# Patient Record
Sex: Female | Born: 1960 | Race: White | Hispanic: No | Smoking: Former smoker
Health system: Southern US, Community
[De-identification: ages and names within clinical notes are randomized; demographics above are authoritative.]

## PROBLEM LIST (undated history)

## (undated) DIAGNOSIS — H539 Unspecified visual disturbance: Secondary | ICD-10-CM

## (undated) DIAGNOSIS — M1990 Unspecified osteoarthritis, unspecified site: Secondary | ICD-10-CM

## (undated) DIAGNOSIS — N189 Chronic kidney disease, unspecified: Secondary | ICD-10-CM

## (undated) DIAGNOSIS — T4145XA Adverse effect of unspecified anesthetic, initial encounter: Secondary | ICD-10-CM

## (undated) DIAGNOSIS — T8859XA Other complications of anesthesia, initial encounter: Secondary | ICD-10-CM

## (undated) DIAGNOSIS — G629 Polyneuropathy, unspecified: Secondary | ICD-10-CM

## (undated) DIAGNOSIS — G35D Multiple sclerosis, unspecified: Secondary | ICD-10-CM

## (undated) DIAGNOSIS — G35 Multiple sclerosis: Secondary | ICD-10-CM

## (undated) HISTORY — DX: Unspecified osteoarthritis, unspecified site: M19.90

## (undated) HISTORY — DX: Chronic kidney disease, unspecified: N18.9

## (undated) HISTORY — PX: ENDOMETRIAL ABLATION: SHX621

## (undated) HISTORY — PX: HIP FRACTURE SURGERY: SHX118

## (undated) HISTORY — PX: TUBAL LIGATION: SHX77

## (undated) HISTORY — PX: CARPAL TUNNEL RELEASE: SHX101

## (undated) HISTORY — PX: OTHER SURGICAL HISTORY: SHX169

## (undated) HISTORY — DX: Multiple sclerosis, unspecified: G35.D

## (undated) HISTORY — DX: Unspecified visual disturbance: H53.9

## (undated) HISTORY — DX: Multiple sclerosis: G35

## (undated) HISTORY — DX: Polyneuropathy, unspecified: G62.9

---

## 1998-08-21 ENCOUNTER — Other Ambulatory Visit: Admission: RE | Admit: 1998-08-21 | Discharge: 1998-08-21 | Payer: Self-pay | Admitting: Obstetrics and Gynecology

## 1999-11-14 ENCOUNTER — Other Ambulatory Visit: Admission: RE | Admit: 1999-11-14 | Discharge: 1999-11-14 | Payer: Self-pay | Admitting: Obstetrics and Gynecology

## 2000-12-30 ENCOUNTER — Other Ambulatory Visit: Admission: RE | Admit: 2000-12-30 | Discharge: 2000-12-30 | Payer: Self-pay | Admitting: Obstetrics and Gynecology

## 2002-06-15 ENCOUNTER — Other Ambulatory Visit: Admission: RE | Admit: 2002-06-15 | Discharge: 2002-06-15 | Payer: Self-pay | Admitting: Obstetrics and Gynecology

## 2003-10-11 ENCOUNTER — Other Ambulatory Visit: Admission: RE | Admit: 2003-10-11 | Discharge: 2003-10-11 | Payer: Self-pay | Admitting: Obstetrics and Gynecology

## 2004-01-31 ENCOUNTER — Ambulatory Visit: Payer: Self-pay | Admitting: Cardiology

## 2004-02-06 ENCOUNTER — Ambulatory Visit: Payer: Self-pay | Admitting: Cardiovascular Disease

## 2004-03-15 ENCOUNTER — Ambulatory Visit: Payer: Self-pay | Admitting: Cardiology

## 2004-03-19 ENCOUNTER — Ambulatory Visit: Payer: Self-pay | Admitting: Cardiology

## 2004-05-08 ENCOUNTER — Ambulatory Visit: Payer: Self-pay | Admitting: Cardiology

## 2004-05-14 ENCOUNTER — Ambulatory Visit: Payer: Self-pay | Admitting: Cardiology

## 2004-07-24 ENCOUNTER — Ambulatory Visit: Payer: Self-pay | Admitting: Cardiology

## 2004-07-30 ENCOUNTER — Ambulatory Visit: Payer: Self-pay | Admitting: Cardiology

## 2004-09-03 ENCOUNTER — Ambulatory Visit: Payer: Self-pay | Admitting: Internal Medicine

## 2004-09-10 ENCOUNTER — Ambulatory Visit: Payer: Self-pay | Admitting: Cardiology

## 2004-11-08 ENCOUNTER — Other Ambulatory Visit: Admission: RE | Admit: 2004-11-08 | Discharge: 2004-11-08 | Payer: Self-pay | Admitting: Obstetrics and Gynecology

## 2005-01-09 ENCOUNTER — Ambulatory Visit: Payer: Self-pay | Admitting: Internal Medicine

## 2005-01-14 ENCOUNTER — Ambulatory Visit: Payer: Self-pay | Admitting: Internal Medicine

## 2005-03-08 ENCOUNTER — Ambulatory Visit: Payer: Self-pay | Admitting: Internal Medicine

## 2005-04-04 ENCOUNTER — Ambulatory Visit: Payer: Self-pay | Admitting: Internal Medicine

## 2005-04-25 ENCOUNTER — Ambulatory Visit: Payer: Self-pay | Admitting: Cardiology

## 2005-06-11 ENCOUNTER — Encounter (INDEPENDENT_AMBULATORY_CARE_PROVIDER_SITE_OTHER): Payer: Self-pay | Admitting: *Deleted

## 2005-06-11 ENCOUNTER — Ambulatory Visit (HOSPITAL_COMMUNITY): Admission: RE | Admit: 2005-06-11 | Discharge: 2005-06-11 | Payer: Self-pay | Admitting: Obstetrics and Gynecology

## 2005-08-09 ENCOUNTER — Ambulatory Visit: Payer: Self-pay | Admitting: Internal Medicine

## 2005-08-15 ENCOUNTER — Ambulatory Visit: Payer: Self-pay | Admitting: Internal Medicine

## 2005-12-24 ENCOUNTER — Ambulatory Visit (HOSPITAL_COMMUNITY): Admission: RE | Admit: 2005-12-24 | Discharge: 2005-12-25 | Payer: Self-pay | Admitting: Obstetrics and Gynecology

## 2005-12-24 ENCOUNTER — Encounter (INDEPENDENT_AMBULATORY_CARE_PROVIDER_SITE_OTHER): Payer: Self-pay | Admitting: *Deleted

## 2006-03-07 ENCOUNTER — Ambulatory Visit: Payer: Self-pay | Admitting: Internal Medicine

## 2006-03-07 LAB — CONVERTED CEMR LAB
AST: 64 units/L — ABNORMAL HIGH (ref 0–37)
Alkaline Phosphatase: 56 units/L (ref 39–117)
Chol/HDL Ratio, serum: 3.2
Cholesterol: 115 mg/dL (ref 0–200)
Total Bilirubin: 0.9 mg/dL (ref 0.3–1.2)
Total Protein: 6.9 g/dL (ref 6.0–8.3)
Triglyceride fasting, serum: 63 mg/dL (ref 0–149)

## 2006-03-13 ENCOUNTER — Ambulatory Visit: Payer: Self-pay | Admitting: Cardiology

## 2006-08-19 ENCOUNTER — Ambulatory Visit: Payer: Self-pay | Admitting: Gastroenterology

## 2006-08-22 ENCOUNTER — Encounter: Payer: Self-pay | Admitting: Gastroenterology

## 2006-08-22 ENCOUNTER — Ambulatory Visit: Payer: Self-pay | Admitting: Gastroenterology

## 2006-09-17 ENCOUNTER — Ambulatory Visit: Payer: Self-pay | Admitting: Internal Medicine

## 2006-09-17 LAB — CONVERTED CEMR LAB
ALT: 30 units/L (ref 0–35)
AST: 40 units/L — ABNORMAL HIGH (ref 0–37)
Alkaline Phosphatase: 59 units/L (ref 39–117)
Bilirubin, Direct: 0.3 mg/dL (ref 0.0–0.3)
HDL: 23.6 mg/dL — ABNORMAL LOW (ref >39.0)
VLDL: 29 mg/dL (ref 0–40)

## 2006-10-02 ENCOUNTER — Ambulatory Visit: Payer: Self-pay | Admitting: Cardiology

## 2007-07-08 ENCOUNTER — Encounter: Admission: RE | Admit: 2007-07-08 | Discharge: 2007-10-06 | Payer: Self-pay | Admitting: Neurology

## 2007-07-15 ENCOUNTER — Ambulatory Visit: Payer: Self-pay | Admitting: Cardiology

## 2007-07-15 LAB — CONVERTED CEMR LAB
ALT: 25 units/L (ref 0–35)
Bilirubin, Direct: 0.2 mg/dL (ref 0.0–0.3)
HDL: 23.7 mg/dL — ABNORMAL LOW (ref >39.0)
LDL Cholesterol: 68 mg/dL (ref 0–99)
Total Bilirubin: 0.8 mg/dL (ref 0.3–1.2)
Total CHOL/HDL Ratio: 4.6
VLDL: 17 mg/dL (ref 0–40)

## 2007-07-23 ENCOUNTER — Ambulatory Visit: Payer: Self-pay | Admitting: Internal Medicine

## 2007-09-29 ENCOUNTER — Ambulatory Visit: Payer: Self-pay | Admitting: Cardiology

## 2007-09-29 ENCOUNTER — Telehealth: Payer: Self-pay | Admitting: Gastroenterology

## 2007-09-29 LAB — CONVERTED CEMR LAB
Albumin: 4.1 g/dL (ref 3.5–5.2)
Cholesterol: 136 mg/dL (ref 0–200)
HDL: 41.5 mg/dL (ref >39.0)
LDL Cholesterol: 69 mg/dL (ref 0–99)
Total CHOL/HDL Ratio: 3.3
Total Protein: 6.7 g/dL (ref 6.0–8.3)
Triglycerides: 126 mg/dL (ref 0–149)
VLDL: 25 mg/dL (ref 0–40)

## 2007-10-15 ENCOUNTER — Ambulatory Visit: Payer: Self-pay | Admitting: Internal Medicine

## 2007-12-29 ENCOUNTER — Telehealth: Payer: Self-pay | Admitting: Gastroenterology

## 2008-03-17 ENCOUNTER — Ambulatory Visit (HOSPITAL_BASED_OUTPATIENT_CLINIC_OR_DEPARTMENT_OTHER): Admission: RE | Admit: 2008-03-17 | Discharge: 2008-03-17 | Payer: Self-pay | Admitting: Urology

## 2008-03-18 ENCOUNTER — Telehealth: Payer: Self-pay | Admitting: Gastroenterology

## 2008-04-08 ENCOUNTER — Ambulatory Visit (HOSPITAL_BASED_OUTPATIENT_CLINIC_OR_DEPARTMENT_OTHER): Admission: RE | Admit: 2008-04-08 | Discharge: 2008-04-08 | Payer: Self-pay | Admitting: Orthopedic Surgery

## 2008-08-02 ENCOUNTER — Other Ambulatory Visit: Admission: RE | Admit: 2008-08-02 | Discharge: 2008-08-02 | Payer: Self-pay | Admitting: Family Medicine

## 2010-06-25 LAB — POCT HEMOGLOBIN-HEMACUE
Hemoglobin: 12.2 g/dL (ref 12.0–15.0)
Hemoglobin: 11.9 g/dL — ABNORMAL LOW (ref 12.0–15.0)

## 2010-06-25 LAB — URINE CULTURE: Colony Count: NO GROWTH

## 2010-07-24 NOTE — Assessment & Plan Note (Signed)
[facility]                               LIPID CLINIC NOTE   NAME:Kellie [name], Kellie [name]                   MRN:          [id number]  DATE:10/02/2006                            DOB:          March 02, 1961    The patient is seen in the lipid clinic for further evaluation,  medication titration associated with her hyperlipidemia in the setting  of documented coronary disease.  The patient has been feeling okay.  She  has been under increased stress associated with her son.  She has been  compliant with her medications.  She has reached her donut hole and  continues in that.  Her treatment for thrush has finally ceased.  She  has been feeling and doing well overall, otherwise.  She continues  Jazzercise for 1-2 hours a day, 5-7 days a week.   PAST MEDICAL HISTORY:  1. Hypercholesterolemia.  2. Multiple sclerosis.  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  4. Multiple drug allergies.  5. Hiatal hernia.   CURRENT MEDICATIONS:  1. Neurontin 600 mg 3 times daily.  2. Avonex 1 shot weekly.  3. Ditropan XL 10 mg twice daily.  4. Amantidine 100 mg twice daily.  5. Ritalin 20 mg twice daily.  6. Oxytrol patch 2 each week.  7. Lipitor 40 mg daily.  8. Fenofibrate 200 mg daily.  9. Cymbalta 90 mg daily.  10.Levaquin 250 mg each Monday, Wednesday, Friday.  11.Xanax 0.25 mg as needed.  12.Astelin nasal spray at bedtime.  13.Nasacort nasal spray at bedtime.  14.Optivar eye drops at bedtime.  15.Nexium 40 mg p.o. daily.  16.Valium 2.5 mg in the evening.  17.Juice Plus twice daily.  18.Advil as needed for muscle aches and pains and prior to Avonex as      well as acetaminophen as needed.  19.Allergy shots each week.   DRUG ALLERGIES:  PERCOCET and ERYTHROMYCIN ETHYL SUCCINATE.   LABORATORY DATA:  Reveals normal LFTs excepting an AFT of 40, these are  overall much improved from the patient's last panel.  Total cholesterol  122, triglyceride 146, HDL 23.6  which is not as good as it has been,  though she may have had increased stress,and lessened exercise.  LDL  cholesterol is 69.   PHYSICAL EXAMINATION:  Reveals patient weight of 138 pounds.  Blood  pressure is 130/76, heart rate is 84 and respirations are 15.   ASSESSMENT:  The patient has made good progress towards lipid-lowering  goals and has been compliant with her regimen.  I am pleased to see that  her liver functions have improved dramatically.  Will continue current  therapies.  Have provided samples to the patient to facilitate  compliance, and we will see her back in 6 months.  She will call with  questions or problems in the meantime.  Thank you for the opportunity to  care for this pleasant patient.      [name], PharmD, BCPS, CPP  Electronically Signed      Rollene Rotunda, MD, [facility]  Electronically Signed   MP/MedQ  DD: 10/10/2006  DT: 10/11/2006  Job #:  [id number]   cc:   Pricilla Riffle, MD, [facility]

## 2010-07-24 NOTE — Assessment & Plan Note (Signed)
[facility]                               LIPID CLINIC NOTE   NAME:Kellie [name], Kellie [name]                   MRN:          [id number]  DATE:10/15/2007                            DOB:          [birth year]    HISTORY:  The patient was seen in Lipid Clinic for hyperlipidemia and  multiple medication intolerances.  She has been feeling and doing well  since her last visit.  She continues on her 7 jazzercise classes each  week, 6 days a week.  She continues to follow an okay diet; in that she  monitors her fat and cholesterol intake, though she does continue to  have significant sugar intake on a regular basis.  She cannot tell me  that the Tysabri, that has been started for her multiple sclerosis, has  made any difference.  She states that she has not lost further activity.  She continues to be under a great deal of stress as she is considering  filing separation papers and continues to have several additional family  issues that are providing complex family relationship.   PAST MEDICAL HISTORY:  1. Hypercholesterolemia.  2. Multiple sclerosis.  3. Medullary sponge disease in the kidneys.  4. Multiple drug allergies and hiatal hernia.   CURRENT MEDICATIONS:  1. Neurontin 600 mg 3 times daily.  2. Avonex has been discontinued.  3. Ditropan XL 10 mg twice daily.  4. Lipitor 40 mg in the evening.  5. Phenobarbitone was discontinued at her last visit.  6. Cymbalta 90 mg daily.  7. Levaquin, Monday, Wednesday, Friday, 250 mg.  8. Xanax 0.25 mg daily as needed.  9. Astelin nasal spray daily at bedtime.  10.Nasacort nasal spray daily at bedtime.  11.Optivar eye drops daily.  12.Nexium 40 mg daily.  13.The patient also takes Ritalin 20 mg in the morning.  14.Astepro in the evening.  15.Tysabri every 4 weeks at Dr. Bonnita Hollow office.   ALLERGIES:  ERYTHROMYCIN ETHYLSUCCINATE, PERCOCET, LATEX THAT IS FOUND  IN TAPE and BAND-AID, and BABY POWDER.   REVIEW OF  SYSTEMS:  As stated in the HPI, otherwise negative.   PHYSICAL EXAMINATION:  Blood pressure today is 110/68, respirations are  16 and comfortable, and heart rate is 76.   LABORATORY DATA:  Revealed normal LFTs.  Total cholesterol 136,  triglyceride 126, HDL 41.5, and LDL 69.   ASSESSMENT:  The patient meets primary, secondary, and tertiary goals  for lipid lowering therapy at this time.  We will continue her  therapies.  I have congratulated her on being able to come off her  TriCor.  We will see her back in 6 months.  She will call if questions  or problems in the meantime.   Thank you for the opportunity to see this patient.      [name], PharmD, BCPS, CPP  Electronically Signed      Rollene Rotunda, MD, [facility]  Electronically Signed   MP/MedQ  DD: 10/15/2007  DT: 10/16/2007  Job #: [id number]   cc:   Thomas C. Daleen Squibb, MD, [facility]  Rollene Rotunda, MD, [facility]

## 2010-07-24 NOTE — Op Note (Signed)
Kellie [name], Kellie [name]               ACCOUNT NO.:  [id number]   MEDICAL RECORD NO.:  [id number]          PATIENT TYPE:  AMB   LOCATION:  [facility]                          FACILITY:  [facility]   PHYSICIAN:  Kellie Fitch. [name], M.D. DATE OF BIRTH:  January 02, 1961   DATE OF PROCEDURE:  04/08/2008  DATE OF DISCHARGE:                               OPERATIVE REPORT   PREOPERATIVE DIAGNOSIS:  Chronic entrapment neuropathy, right median  nerve at carpal tunnel.   POSTOPERATIVE DIAGNOSIS:  Chronic entrapment neuropathy, right median  nerve at carpal tunnel.   OPERATION:  Release of right transverse carpal ligament.   OPERATING SURGEON:  Kellie Fitch. Sypher, MD   ASSISTANT:  Marveen Reeks Dasnoit, PA-C   ANESTHESIA:  General by LMA.   SUPERVISING ANESTHESIOLOGIST:  Burna Forts, MD   INDICATIONS:  Kellie [name] is a 49 year old woman referred through  the courtesy of Dr. Epimenio Foot for evaluation of right carpal tunnel  syndrome.  Kellie [name] has a very complex medical history with a history  of multiple sclerosis, hypercholesterolemia, a history of medullary  sponge disease of the kidneys and a neurogenic bladder.   Dr. Epimenio Foot is a neurologist in [place], [place].  She is  referred due to a history of hand numbness and electrodiagnostic studies  confirming carpal tunnel syndrome.   Due to a failure to response to nonoperative measures, she is brought to  the operating room at this time for release of her right transverse  carpal ligament.   PROCEDURE IN DETAIL:  Kellie [name] was brought to the operating  room and placed in supine position upon the operating table.   Following the induction of general anesthesia by LMA technique, the  right arm was prepped with Betadine soap and solution and sterilely  draped.  A pneumatic tourniquet was applied to the proximal right  brachium.  Following exsanguination of the limb with an Esmarch bandage,  an arterial tourniquet was inflated to 220  mmHg.   Procedure commenced with a short incision in the line of the ring finger  in the palm.  Subcutaneous tissues were carefully divided within the  palmar fascia.  This was split longitudinally to the common sensory  branch of the median nerve.  These were followed back to transverse  carpal ligaments and gently isolated to the median nerve.  Ligament was  then gently separated from the median nerve with a Penfield 4 elevator  followed by release of the ligament subcutaneously into the distal  forearm.  This widely opened the carpal canal.  There was thickening of  the ulnar bursa.  No mass or other predicaments were noted.  Bleeding  points along the margin of the released ligament were electrocauterized  with bipolar current followed by repair of the skin with intradermal 3-0  Prolene suture.   A compressive dressing was applied with a volar plaster splint  maintaining the wrist in 5 degrees of dorsiflexion.   For aftercare, Kellie [name] was provided a prescription for Darvocet-N  100 one p.o. q.6 h. p.r.n. pain.   She states she has Vicodin  at home and may use this is as an alternative  medication.  We have also suggested that she use Advil 400 mg to 600 mg  p.o. q.6 h. as an adjunctive analgesic.      Kellie [name], M.D.  Electronically Signed     RVS/MEDQ  D:  04/08/2008  T:  04/08/2008  Job:  [id number]   cc:   Despina Arias
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
## 2010-07-24 NOTE — Assessment & Plan Note (Signed)
[facility]                         GASTROENTEROLOGY OFFICE NOTE   NAME:Kellie [name]                   MRN:          [id number]  DATE:08/19/2006                            DOB:          April 09, 1960    [name] is a 50 year old white female here for evaluation of  difficultly swallowing.   [name] has had multiple sclerosis for the last 10 years with  associated chronic fatigue and multiple complaints. She underwent a  total abdominal hysterectomy this past fall in October, and since that  time has had dry mouth, odynophagia, and dysphagia. She apparently had  several courses of Diflucan for oral candidiasis although she has never  had endoscopy. She recently underwent upper GI series which showed  narrowing of the cervical esophagus without other abnormalities. She  does take Nexium 40 mg a day for acid reflux, and apparently had  endoscopy several years ago although I do not have records of such in  our records.   The patient denies other gastrointestinal complaints such has change in  bowel habits, melena, hematochezia, or any hepatobiliary problems. Her  appetite is good but she has lost 7 to 8 pounds over the last year. She  denies any specific food intolerances.   PAST MEDICAL HISTORY:  She is followed closely by [facility] neurology  for multiple sclerosis and chronic fatigue. She additionally has  medullary sponge disease of her kidneys, numerous allergies,  hyperlipidemia, chronic bladder dysfunction, and apparently peripheral  neuropathy. She sees Dr. Danella Deis for a depigmentating skin rash of  unexplained etiology. On review of her chart. She has had abnormal liver  function test with transaminases approximately twice normal for the last  2 years. This apparently has been felt to be secondary to her statin  medicine. The patient has had previous hysterectomy as mentioned above,  and tubal ligation. She additionally suffers from  asthmatic bronchitis  and carpal tunnel syndrome.   MEDICATIONS:  1. Neurontin 600 mg three times a day.  2. Beta interferon one shot a week.  3. Ditropan XL 10 mg twice a day.  4. Amantadine 100 mg twice a day.  5. Ritalin 20 mg twice a day.  6. Oxytrol patch 2 per week.  7. Lipitor 40 mg a day.  8. Fenofibrate 200 mg a day.  9. Cymbalta 90 mg a day.  10.Levaquin on a regular basis as needed for UTIs.  11.Xanax 0.25 mg p.r.n.  12.__________ nasal spray.  13.Nasacort.  14.__________ eye drops p.r.n.  15.Nexium 40 mg a day.  16.Valium 2.5 mg at bedtime.  17.P.R.N. Advil.  18.Tylenol.   In the past she has had reactions to erythromycin.   FAMILY HISTORY:  Noncontributory.   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 She does not smoke  and uses ethanol socially.   REVIEW OF SYSTEMS:  Remarkable for excessive fatigue, chronic sore  throat, urinary incontinence, diffuse myalgias, and arthralgias.  Confusion and depression, excessive urination,  chronic back pain,  diplopia, edema of her lower extremities, insomnia, excessive thirst,  etc.   PHYSICAL EXAMINATION:  Shows to be an attractive white female appearing  her state age in no acute distress. I can not appreciate stigmata of  chronic liver disease. She is 5 feet 3 inches tall and weighs 105  pounds. Blood pressure 124/72. Pulse was 72 and regular.  There was no thyromegaly noted.  CHEST: Entirely clear and she appeared to be in a regular rhythm without  significant murmurs, gallops, or rubs. I could not appreciate  hepatosplenomegaly, abdominal masses, or tenderness.  PERIPHERAL EXTREMITIES: Unremarkable.  Inspection of the oral pharynx did show what appeared to be some thrush  on her tongue. I could appreciate no other mucosal lesions.  NEUROLOGICALLY: She was oriented x3. A formal neurologic exam was not  performed.   ASSESSMENT:  1.  Possible persistent candidiasis esophagus versus esophageal      motility disorder.  2. History of chronic gastroesophageal reflux disease on chronic      proton pump inhibitor therapy.  3. Multiple sclerosis with numerous attendant complications.  4. Chronic fatigue and chronic pain syndrome on multiple medications.  5. History of spastic bladder with dry mouth probably related to      Ditropan use.  6. Status post hysterectomy.  7. History of medullary sponge disease in kidney.  8. Chronic anxiety and depression.   RECOMMENDATIONS:  1. Repeat endoscopic exam at her convenience.  2. Continue all other medications as listed above.  3. Patient will need screening colonoscopy in the near future since      she is approaching the age of 36.     Vania Rea. Jarold Motto, MD, [facility], FAGA  Electronically Signed    DRP/MedQ  DD: 08/19/2006  DT: 08/19/2006  Job #: [id number]   cc:   Stacie Acres. Cliffton Asters, M.D.  Richard [employer] L. Earlene Plater, M.D.  Guy Sandifer Henderson Cloud, M.D.  Hope M. Danella Deis, M.D.

## 2010-07-24 NOTE — Op Note (Signed)
NAMECHRISTALYN, [name]               ACCOUNT NO.:  [id number]   MEDICAL RECORD NO.:  [id number]          PATIENT TYPE:  AMB   LOCATION:  [facility]                         [facility]ACILITY[facility]  [facility]   PHYSICIAN:  Martina Sinner, MD DATE OF BIRTH:  May 30, 1960   DATE OF PROCEDURE:  DATE OF DISCHARGE:                               OPERATIVE REPORT   DIAGNOSIS:  Muscle spasm, neurogenic bladder, refractory urge  incontinence.   SURGERY:  Cystoscopy, hydrodistention, Botox injection.   Kellie [name] has the above diagnosis.  She catheterizes twice a day.  She  may need to catheterize more frequent post Botox.   The patient is prepped and draped in the usual fashion.  ACMI scope is  utilized.  She had grade 2 to grade 3 out of 4 bladder trabeculation.  There are no diverticula.  Trigone is easily identified with ureteral  orifices.  There is no obvious cystitis.  I did send urine for culture.  She was given preoperative antibiotics.   I injected 200 cc and 20 cc of normal saline and Botox with my usual  template intramuscularly.  There is little to no bleeding.  Bladder was  emptied.  The rest of the inspection of the bladder mucosa and trigone  were normal.  She was hydrodistended at 500 mL, but on re-inspection,  she did not have any findings in keeping with interstitial cystitis.   Kellie [name] will be followed up in clinic and hopefully this will reach  her treatment goal.           ______________________________  Martina Sinner, MD  Electronically Signed     SAM/MEDQ  D:  03/17/2008  T:  03/17/2008  Job:  [id number]

## 2010-07-27 NOTE — Op Note (Signed)
[name], [name]               ACCOUNT NO.:  [id number]   MEDICAL RECORD NO.:  [id number]          PATIENT TYPE:  AMB   LOCATION:  SDC                           FACILITY:  [facility]   PHYSICIAN:  Kellie Sandifer. Henderson [name], M.D. DATE OF BIRTH:  Nov 26, 1960   DATE OF PROCEDURE:  12/24/2005  DATE OF DISCHARGE:                                 OPERATIVE REPORT   PREOPERATIVE DIAGNOSIS:  Metromenorrhagia.   POSTOPERATIVE DIAGNOSIS:  1. Metromenorrhagia.  2. Endometriosis.   PROCEDURE:  Laparoscopically-assisted vaginal hysterectomy with bilateral  salpingo-oophorectomy and ablation of endometriosis.   SURGEON:  Kellie Hedge, MD   ASSISTANT:  Kellie Cairo, MD   ANESTHESIA:  General endotracheal intubation.   SPECIMENS:  Uterus, bilateral tubes and ovaries to pathology.   ESTIMATED BLOOD LOSS:  100 mL.   INDICATIONS AND CONSENT:  This patient is a 50 year old married white female  G2, P1, status post tubal ligation and status post by endometrial ablation  who continues to have heavy irregular menses.  Details are dictated in the  history and physical.  Laparoscopically-assisted vaginal hysterectomy with  bilateral salpingo-oophorectomy has been discussed preoperatively.  Potential risks and complications are discussed preoperatively including but  limited to infection, bowel, bladder, ureteral damage, bleeding requiring  transfusion of blood products with possible transfusion reaction, HIV and  hepatitis acquisition, DVT, PE, pneumonia, fistula formation, postoperative  dyspareunia, laparotomy.  Issues of menopause have also been reviewed.  All  questions were answered and consent signed on the chart.   FINDINGS:  Upper abdomen is grossly normal.  Tubes are status post ligation.  Ovaries were normal.  Anterior cul-de-sac contains of scarring over the  vesicouterine peritoneum secondary to her previous cesarean section.  There  is a single implant of dark black endometriosis on the  vesicouterine  peritoneum in the midline.  Posteriorly there is a dark blue implant of  endometriosis on the right uterosacral ligament and white puckered areas  consistent with endometriosis on the left uterosacral ligament.  There is  also a heavy black implant of endometriosis over the course of the left  ureter high in the pelvis.   PROCEDURE:  The patient is taken to operating room where she is identified,  placed in dorsosupine position and general anesthesia is induced via  endotracheal intubation.  She is then placed in dorsal lithotomy position  where she is prepped abdominally and vaginally, bladder straight  catheterized.  Hulka tenaculum was placed in the uterus as a manipulator and  she is draped in sterile fashion.  The infraumbilical and suprapubic areas  were infiltrated midline with 1/2% plain Marcaine.  Small infraumbilical  incision is made and a disposable Veress needle was placed with a normal  syringe and drop test.  Two liters of gas were insufflated under low  pressure with good tympany in the right upper quadrant.  Veress needle was  removed and a 10/11 XL bladeless disposable trocar sleeve was placed, using  direct visualization with the diagnostic laparoscopic.  After placement it  was replaced with the operative laparoscopic.  Small suprapubic incision was  made in  the midline and a 5-mm XL bladeless disposable trocar sleeve was  placed under direct visualization without difficulty.  The above findings  were noted.  After noting the course of the ureters, the implants of  endometriosis are cauterized.  However, the implant over the course of the  left ureter high in the pelvis is not due to its location.  Then using the  Gyrus bipolar cautery cutting instrument, the infundibulopelvic ligament was  taken down on the right side working across the mesosalpinx across the round  ligament down to the level of the vesicouterine peritoneum.  Similar  procedure is  carried out on the left side.  Good hemostasis is noted.  Vesicouterine peritoneum was taken down cephalolaterally and good hemostasis  is maintained.  Suprapubic trocar sleeve was removed.  Instruments are  removed and attention is turned to vagina.  Posterior cul-de-sac was entered  sharply and the cervix was circumscribed with unipolar cautery.  Mucosa was  advanced sharply and bluntly.  Then using the Gyrus bipolar cautery  instrument, the uterosacral ligaments taken down followed by the bladder  pillars, cardinal ligaments and uterine vessels bilaterally.  Anterior cul-  de-sac is entered without difficulty.  The fundus with tubes and ovaries is  delivered posteriorly and the remaining pedicles were taken down and  specimens delivered.  The uterosacral ligaments are plicated to the vaginal  cuff bilaterally with separate sutures of 0 Monocryl.  All suture will be 0  Monocryl unless otherwise designated.  Suture was also placed at the cuff of  the 3 and 9 o'clock position to assure hemostasis.  Uterosacral ligaments  are plicated midline with a third suture.  Cuff was closed with figure-of-  eights.  Foley catheter is placed in the bladder and clear urine is noted.  Returning to the abdomen, pneumoperitoneum is reintroduced.  Again the 5 mm  suprapubic trocar sleeve is again placed under direct visualization without  difficulty.  Careful inspection reveals minor bleeding at peritoneal edges  which is controlled with bipolar cautery.  Reinspection under reduced  pneumoperitoneum reveals good hemostasis.  Excess fluid is removed.  All  instruments are removed and the pneumoperitoneum was reduced as well.  Skin  incisions were closed with Dermabond.  All counts correct.  The patient is  awakened, taken to recovery room in stable condition.      Kellie [name], M.D.  Electronically Signed    JET/MEDQ  D:  12/24/2005  T:  12/25/2005  Job:  [id number]

## 2010-07-27 NOTE — Op Note (Signed)
[name], Kellie [name]               ACCOUNT NO.:  [id number]   MEDICAL RECORD NO.:  [id number]          PATIENT TYPE:  AMB   LOCATION:  SDC                           FACILITY:  [facility]   PHYSICIAN:  Guy Sandifer. Henderson Cloud, M.D. DATE OF BIRTH:  06-18-1960   DATE OF PROCEDURE:  06/11/2005  DATE OF DISCHARGE:                                 OPERATIVE REPORT   PREOPERATIVE DIAGNOSIS:  Menorrhagia.   POSTOPERATIVE DIAGNOSIS:  Menorrhagia.   PROCEDURE:  NovaSure endometrial ablation with hysteroscopy, dilatation,  curettage of 1% Xylocaine paracervical block.   SURGEON:  Harold Hedge, M.D.   ANESTHESIA:  MAC.   SPECIMENS:  Endometrial curettings.   ESTIMATED BLOOD LOSS:  Less than 50 mL.   INTAKES AND OUTPUTS:  sorbitol distending media, 70-mL deficit   INDICATIONS AND CONSENT:  This patient is a 50 year old married white  female, G2, P1, status post tubal ligation with heavy menses.  Details are  dictated in the history and physical.  Hysteroscopy, dilatation, curettage  and endometrial ablation have been discussed with the patient  preoperatively.  Potential risks and complications were reviewed  preoperatively including, but limited to, infection, uterine perforation,  organ damage, bleeding requiring transfusion of blood products with possible  transfusion reaction, HIV and hepatitis acquisition, DVT, PE, pneumonia,  hysterectomy and recurrent heavy or abnormal bleeding.  All questions have  been answered and consent is signed and on the chart.   FINDINGS:  Endometrial canal is without abnormal structure.   PROCEDURE:  The patient is taken to operating room, where she is identified,  placed in the dorsal supine position and given sedation.  She is placed in  dorsal lithotomy position, where she is gently prepped, bladder straight-  catheterized and she is draped in a sterile fashion.  Bivalve speculum is  placed in the vagina.  Anterior cervical lip is injected with 1% Xylocaine  and grasped with a single-tooth tenaculum.  Paracervical block is then  placed with approximately 20 mL total of 1% plain Xylocaine at the 2, 4, 5,  7, 8  and 10 o'clock positions.  Uterus sounds to 8 cm and the cervix sounds  to 4 cm.  Cervix is dilated to a 29 dilator.  Diagnostic hysteroscope is  placed in the endocervical canal and advanced under direct visualization  using sorbitol distending media.  The above findings are noted.  Hysteroscope is withdrawn and sharp curettage is carried out.  The NovaSure  device is then placed.  A cavity width of 3.7 cm is noted.  The ablation is  then carried out for 1 minute 59 seconds.  The NovaSure is removed intact.  Reinspection with the diagnostic hysteroscope reveals no evidence of  perforation.  Hysteroscope is withdrawn, instruments are removed and the  procedure is ended.  All counts are correct.  The patient is taken to  recovery room in stable condition.      Guy Sandifer Henderson Cloud, M.D.  Electronically Signed     JET/MEDQ  D:  06/11/2005  T:  06/11/2005  Job:  [id number]

## 2010-07-27 NOTE — H&P (Signed)
NAMECAILA, Kellie [name]               ACCOUNT NO.:  [id number]   MEDICAL RECORD NO.:  [id number]          PATIENT TYPE:  AMB   LOCATION:  SDC                           [facility]ILITY:  [facility]   PHYSICIAN:  Guy Sandifer. Henderson Cloud, M.D. DATE OF BIRTH:  10-29-1960   DATE OF ADMISSION:  06/11/2005  DATE OF DISCHARGE:                                HISTORY & PHYSICAL   CHIEF COMPLAINT:  Heavy menses.   HISTORY OF PRESENT ILLNESS:  This patient is a 50 year old  Monday/Wednesday/Friday, gravida 2, para 1, status post tubal ligation who  has increasingly heavy menses.  After discussion of the options of  management, she is being admitted for NovaSure endometrial ablation.  Potential risks and complications have been discussed preoperatively.   PAST MEDICAL HISTORY:  1.  Multiple sclerosis.  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  3.  Hiatal hernia.  4.  Mitral valve prolapse.  5.  Dyslipidemia.   PAST SURGICAL HISTORY:  Tubal ligation.   PAST OBSTETRICAL HISTORY:  Cesarean section x1.   FAMILY HISTORY:  Unknown cancer in paternal grandmother.   MEDICATIONS:  1.  Fenofibrate  2.  Methylin  3.  Astelin  4.  Gabapentin  5.  Levaquin 250 mg three times a week.  6.  Nasacort.  7.  Oxytrol.  8.  Amantadine.  9.  Ditropan XL.  10. Lipitor.  11. Betaseron  12. Cymbalta.  13. Nexium.  14. Zyrtec.  15. Optivar eye drops.  16. Xanax.  17. Vitamins.   ALLERGIES:  ERYTHROMYCIN.   PHYSICAL EXAMINATION:  VITAL SIGNS:  Height 5 feet 3-1/4 inches, weight 137-  1/2 pounds, blood pressure 110/70.  LUNGS:  Clear to auscultation.  HEART:  Regular rate and rhythm.  BACK:  Without CVA tenderness.  BREASTS:  Not examined.  ABDOMEN:  Soft and nontender without masses.  PELVIC:  Vulva, vagina, and cervix without lesion.  Uterus normal size,  mobile, and nontender.  Adnexa nontender without masses.  EXTREMITIES:  Grossly normal.   ASSESSMENT:  Menorrhagia.   PLAN:  NovaSure endometrial  ablation.      Guy Sandifer Henderson Cloud, M.D.  Electronically Signed     JET/MEDQ  D:  05/29/2005  T:  05/29/2005  Job:  [id number]

## 2010-07-27 NOTE — H&P (Signed)
Kellie [name], BEACHEM               ACCOUNT NO.:  [id number]   MEDICAL RECORD NO.:  [id number]          PATIENT TYPE:  AMB   LOCATION:  SDC                           [facility]ILITY:  [facility]   PHYSICIAN:  Guy Sandifer. Henderson Cloud, M.D. DATE OF BIRTH:  06/14/1960   DATE OF ADMISSION:  DATE OF DISCHARGE:                                HISTORY & PHYSICAL   DATE OF ADMISSION:  December 24, 2005   CHIEF COMPLAINT:  Heavy menses.   HISTORY OF PRESENT ILLNESS:  This patient is a 50 year old married white  female G2, P1 status post tubal ligation who is also status post endometrial  ablation and continues to have heavy irregular menses.  At the time of her  ablation, a D&C was performed with benign pathology on the endometrial  curettings.  After discussion of the options she is being admitted for  laparoscopy-assisted vaginal hysterectomy and bilateral salpingo-  oophorectomy.  Potential risks, complications, issues of menopause have been  discussed.   PAST MEDICAL HISTORY:  1. Multiple sclerosis.  2. Medullary sponge disease of the kidneys.  3. Hiatal hernia.  4. Mitral valve prolapse.  5. Dyslipidemia.   PAST SURGICAL HISTORY:  1. Tubal ligation.  2. Hysteroscopy D&C and endometrial ablation.   OBSTETRICAL HISTORY:  Cesarean section x1.   FAMILY HISTORY:  Unknown type of cancer in paternal grandfather.   MEDICATIONS:  1. Neurontin 300 mg two b.i.d.  2. Zyrtec.  3. Nasacort AQ.  4. Astelin nasal spray.  5. Optivar eyedrops.  6. Cymbalta 60 mg q.h.s.  7. Oxytrol patch two times a week.  8. Ritalin b.i.d.  9. Amantadine b.i.d.  10.Lipitor 40 mg q.h.s.  11.Ditropan XL 10 mg b.i.d.  12.Vitamin C.  13.Nexium.  14.Generic TriCor.  15.Xanax 0.25 mg p.r.n.  16.Levaquin 500 mg one-half tablet Monday, Wednesday, Friday.  17.Betaseron shots every-other night.  18.Tylenol PM prior to her shot.   DRUG ALLERGIES:  ERYTHROMYCIN.   REVIEW OF SYSTEMS:  NEURO:  Denies headache.  CARDIO:  No chest  pain.  PULMONARY:  Denies shortness of breath.  GI:  Denies recent changes in bowel  habits.  GU:  The patient self-catheterizes twice a day.  Her urologist is  Dr. Darvin Neighbours.   PHYSICAL EXAMINATION:  VITAL SIGNS:  Height 5 feet 3 inches, weight 117  pounds.  Blood pressure 112/70.  HEENT:  Without thyromegaly.  LUNGS:  Clear to auscultation.  HEART:  Regular rate and rhythm.  BACK:  Without CVA tenderness.  BREASTS:  Without mass, traction, discharge.  ABDOMEN:  Soft, nontender, without masses.  PELVIC:  Vulva, vagina, cervix without lesion.  Uterus normal size, mobile,  nontender.  Adnexa nontender without masses.  EXTREMITIES:  Grossly within normal limits.  NEUROLOGIC:  Grossly within normal limits.   ASSESSMENT:  Menometrorrhagia.   PLAN:  Laparoscopic-assisted vaginal hysterectomy with bilateral salpingo-  oophorectomy.      Guy Sandifer Henderson Cloud, M.D.  Electronically Signed     JET/MEDQ  D:  12/23/2005  T:  12/23/2005  Job:  [id number]

## 2010-07-27 NOTE — Assessment & Plan Note (Signed)
[facility]                               LIPID CLINIC NOTE   NAME:CASSELL, TYMBER STALLINGS                   MRN:          [id number]  DATE:03/13/2006                            DOB:          1961/01/01    Ms. Lucita Lora comes in today for followup of her cholesterol management.  She has been compliant with her current cholesterol-lowering medications  which include Lipitor 40 mg daily and TriCor 145 mg daily.  She has not  had any problems tolerating these.  Her medications have not changed.  They include Betaseron, Tylenol PM, Nexium, Neurontin, Zyrtec, Ditropan  XL, Oxytrol patch, Ritalin, Xanax, Cymbalta, calcium with D, vitamin C,  Astelin, Nasacort, Optivar, Estrostep birth control pills, amantidine,  Levaquin, and a multivitamin.  She recently had a hysterectomy and  suffered from thrush afterwards.  This was treated with Diflucan.  Her  diet and exercise program continue to be great with Jazzercise  frequently.  Because of her surgery and thrush she has lost significant  weight.  Her weight today is 114 pounds; this is down by 7 pounds in  June 2007.  Other vitals include blood pressure of 115/80, heart rate  68.  Laboratory data includes total cholesterol 115, triglycerides 63,  HDL 35.8, LDL 67.  ALT is 61, AST is 64.   ASSESSMENT:  Ms. Lucita Lora continues to tolerate her current combination  of medications.  Her triglycerides are at goal of less than 150, her LDL  is at goal of less than 100, her HDL has improved and closer to the goal  of greater than 40, her liver function tests remain slightly elevated  but are trending down.  There are a couple of potential drug  interactions with Diflucan and Lipitor, and Diflucan and Levaquin.  We  talked to Ms. Lucita Lora about this and advised her to hold Lipitor if she  needs to take Diflucan for a short period of time for thrush.  Also,  there is a potential interaction between Levaquin and Diflucan, but Ms.  Lucita Lora has no cardiac history, we are not too concerned of that  interaction, but will continue to monitor her.   PLAN:  We are going to continue the same medications.  We gave her  samples of Lipitor and some prescriptions for Lipitor and TriCor.  We  are going to follow up with her in 6 months and repeat lipid and liver  panels.  She was instructed to call us with any questions or concerns in  the meantime.      Charolotte Eke, PharmD  Electronically Signed      Salvadore Farber, MD  Electronically Signed   TP/MedQ  DD: 03/13/2006  DT: 03/13/2006  Job #: [id number]   cc:   Stacie Acres. Cliffton Asters, M.D.

## 2010-09-27 ENCOUNTER — Ambulatory Visit (HOSPITAL_BASED_OUTPATIENT_CLINIC_OR_DEPARTMENT_OTHER): Admission: RE | Admit: 2010-09-27 | Payer: Medicare Other | Source: Ambulatory Visit | Admitting: Orthopedic Surgery

## 2010-10-09 ENCOUNTER — Ambulatory Visit (HOSPITAL_BASED_OUTPATIENT_CLINIC_OR_DEPARTMENT_OTHER)
Admission: RE | Admit: 2010-10-09 | Discharge: 2010-10-09 | Disposition: A | Discharge status: Home / Self Care | Payer: Medicare Other | Source: Ambulatory Visit | Attending: Orthopedic Surgery | Admitting: Orthopedic Surgery

## 2010-10-09 DIAGNOSIS — K219 Gastro-esophageal reflux disease without esophagitis: Secondary | ICD-10-CM | POA: Insufficient documentation

## 2010-10-09 DIAGNOSIS — F411 Generalized anxiety disorder: Secondary | ICD-10-CM | POA: Insufficient documentation

## 2010-10-09 DIAGNOSIS — G5600 Carpal tunnel syndrome, unspecified upper limb: Secondary | ICD-10-CM | POA: Insufficient documentation

## 2010-10-09 DIAGNOSIS — F3289 Other specified depressive episodes: Secondary | ICD-10-CM | POA: Insufficient documentation

## 2010-10-09 DIAGNOSIS — G35 Multiple sclerosis: Secondary | ICD-10-CM | POA: Insufficient documentation

## 2010-10-09 DIAGNOSIS — Z91040 Latex allergy status: Secondary | ICD-10-CM | POA: Insufficient documentation

## 2010-10-09 DIAGNOSIS — F329 Major depressive disorder, single episode, unspecified: Secondary | ICD-10-CM | POA: Insufficient documentation

## 2010-10-11 NOTE — Op Note (Signed)
NAME:  Kellie [name], Kellie [name]                  ACCOUNT NO.:  [id number]  MEDICAL RECORD NO.:  [id number]  LOCATION:                                 FACILITY:  PHYSICIAN:  Katy Fitch. Kayonna Lawniczak, M.D. 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  OPERATIONS:  Release of left transverse carpal ligament.  OPERATING SURGEON:  Katy Fitch. Uriyah Massimo, MD  ASSISTANT:  Annye Rusk, PA-C.  ANESTHESIA:  General by LMA.  SUPERVISING ANESTHESIOLOGIST:  Dr. Gelene Mink.  INDICATIONS:  Kellie [name] is a 50 year old woman with multiple complex medical problems, multiple drug allergies and a latex allergy.  She has had hand numbness and is status post right carpal tunnel release.  She now returns for left carpal tunnel release.  Preoperatively, her complex medical history was reviewed by our team as well as the anesthesia team.  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  Her surgical experience was conducted with latex allergy precautions.  Preoperatively, she was interviewed by Dr. Gelene Mink and recommended general anesthesia by LMA technique.  She was provided a prescription preoperatively for Vicodin at our office which she states that she is able to tolerate.  She filled the prescription prior to her scheduled surgery and has the medication at home at this time.  Preoperatively, she was reminded the potential risks, benefits of surgery.  Question are invited and answered in detail.  DESCRIPTION OF PROCEDURE:  Kellie [name] was brought to room 2 of the [facility] and placed in supine position on the operating  table.  Following the induction of general anesthesia by LMA technique, the left arm was prepped with Betadine soap solution, sterilely draped.  A pneumatic tourniquet was applied to the proximal left brachium.  Following exsanguination of the left arm with an Esmarch bandage, the arterial tourniquet was inflated to 220 mmHg.  A routine surgical time-out was conducted during which we reviewed her allergies to medication, latex and confirmed the tourniquet was set at 220 mmHg.  Procedure commenced with a short incision in the line of the ring finger and the palm.  Subcutaneous tissues were carefully divided revealing the palmar fascia.  This split longitudinally to reveal the common sensory branch of the median nerve.  These were followed back to the median nerve proper which was gently isolated from the transcarpal ligament with the aid of a [place] [employer], structural.  The ligament was then released with scissors along its ulnar border extending into the distal forearm. This widely opened  the carpal canal.  Bleeding points were electrocauterized with bipolar current.  The wound was then inspected for masses or predicaments.  None were identified.  The skin was then repaired with intradermal 3-0 Prolene suture.  A Steri-Strip and 2% lidocaine was infiltrated for postoperative analgesia.  A compressive dressing was applied with a volar plaster  splint maintaining Ms. Radel's wrist in 10 degrees of dorsiflexion.  For aftercare, she will return to home to the care of her family.  We will see her back for follow up in the office in 8 days.  She will use the Vicodin on an as-needed basis.     Katy Fitch Montarius Kitagawa, M.D.     RVS/MEDQ  D:  10/09/2010  T:  10/09/2010  Job:  [id number]  Electronically Signed by Josephine Igo M.D. on 10/11/2010 09:59:28 AM

## 2014-03-28 ENCOUNTER — Telehealth: Payer: Self-pay | Admitting: Neurology

## 2014-03-28 NOTE — Telephone Encounter (Signed)
Pt is calling stating she had an appointment today at [facility] and they just informed her that they don't have her Tysabri and they don't know when they will have it.  Please call and advise. You can leave message if there is no answer.

## 2014-03-28 NOTE — Telephone Encounter (Signed)
Spoke with Kellie [name] who sts she had an appt for a Tysabri infusion schd at [facility], but [facility] called and cancelled stating her med had not been delivered.  She has not been seen at [facility] yet--appt given with RAS tomorrow, 03-29-14 at 2pm/fim

## 2014-03-29 ENCOUNTER — Ambulatory Visit (INDEPENDENT_AMBULATORY_CARE_PROVIDER_SITE_OTHER): Payer: Medicare Other | Admitting: Neurology

## 2014-03-29 ENCOUNTER — Encounter: Payer: Self-pay | Admitting: Neurology

## 2014-03-29 VITALS — BP 140/80 | HR 72 | Resp 14 | Ht 63.0 in | Wt 136.4 lb

## 2014-03-29 DIAGNOSIS — F32A Depression, unspecified: Secondary | ICD-10-CM

## 2014-03-29 DIAGNOSIS — Z79899 Other long term (current) drug therapy: Secondary | ICD-10-CM | POA: Diagnosis not present

## 2014-03-29 DIAGNOSIS — F329 Major depressive disorder, single episode, unspecified: Secondary | ICD-10-CM | POA: Diagnosis not present

## 2014-03-29 DIAGNOSIS — G35 Multiple sclerosis: Secondary | ICD-10-CM

## 2014-03-29 DIAGNOSIS — R269 Unspecified abnormalities of gait and mobility: Secondary | ICD-10-CM | POA: Insufficient documentation

## 2014-03-29 DIAGNOSIS — Z87718 Personal history of other specified (corrected) congenital malformations of genitourinary system: Secondary | ICD-10-CM

## 2014-03-29 DIAGNOSIS — Z87448 Personal history of other diseases of urinary system: Secondary | ICD-10-CM | POA: Diagnosis not present

## 2014-03-29 DIAGNOSIS — M810 Age-related osteoporosis without current pathological fracture: Secondary | ICD-10-CM | POA: Diagnosis not present

## 2014-03-29 DIAGNOSIS — N319 Neuromuscular dysfunction of bladder, unspecified: Secondary | ICD-10-CM | POA: Diagnosis not present

## 2014-03-29 NOTE — Progress Notes (Addendum)
[facility]  PATIENT: Kellie [name] DOB: 07-22-1960  REFERRING CLINICIAN: Vernona Rieger HISTORY FROM: Patient  REASON FOR VISIT: Multiple Sclerosis   HISTORICAL  CHIEF COMPLAINT:  Chief Complaint  Patient presents with  . Multiple Sclerosis    Initial visit to establish care.  Last Tysabri 03-01-14. Denies new or worsening sx./fim    HISTORY OF PRESENT ILLNESS:  Kellie [name] is a 54 year old woman who presented with left sided numbness in September 1997.   She underwent an MRI and a lumbar puncture and was told MS was unlikely.   She had right sided numbness in January 1998 and had brain and spine MRI.    There were changes suggestive of MS and she was diagnosed in January 1998.   She was started on Betaseron.   She was on Betaseron x 10 years and then switched to Avonex for a year due to tolerability issues.   She had clinical exacerbations and MRI changes and then went on Tysabri in 2007 or 2008 and has had 92 infusions, her last infusion being 03/02/15.    She is transferring care from [facility] to [facility].  She tolerates Tysabri well.   She is JCV Ab negative.   Symptoms she has related to her MS is numbness, decreased gait, urinary retention, dysphagia and fatigue.  She reports tingling in the arms and legs. The right side is affected more than the left. The tingling is usually not too painful.  She is on gabapentin which she feels has helped some of the uncomfortable aspects of the tingling. She tolerates that well. She had her gait and balance generally do well but she does notice some decline when she gets hot or if she does a lot of exercise. She does not note significant weakness in the legs.   However, she has spasticity in her legs  She has had a lot of difficulties with urinary urgency and frequency.  She does intermittent catheterization up to 10 times a day. She is currently on oxybutynin combined with Myrbetriq which have helped the bladder  spasms. She denies any recent urinary tract infections.   She takes macrodantin bid.     She notes difficulty with dysphagia.  This seems to be multifactorial with a combination of dryness in her mouth (from med's?), Clinical changes (she had her esophagus stretched in the past) and possibly from the MS.  She has fatigue but she remains active. She does Jazzercise 5 days a week over 8 different classes. She feels better when she does her exercises.  She has had depression in the past but is generally feeling good currently.   She has mild anxiety that does not bother her too much.      She has not noted any significant cognitive issues and has not noted problems with her memory or speech.  She sleeps well as [place] as she takes the Valium with desmopressin every night.  She is now able to fall asleep easily and stays asleep most of the night. With the desmopressin she doesn't have to wake up to catheterize herself or because of incontinence.  REVIEW OF SYSTEMS:  Constitutional: No fevers, chills, sweats, or change in appetite Eyes: No visual changes, double vision, eye pain Ear, nose and throat: No hearing loss, ear pain, nasal congestion, sore throat Cardiovascular: No chest pain, palpitations Respiratory:  No shortness of breath at rest or with exertion.   No wheezes GastrointestinaI: No nausea, vomiting, diarrhea, abdominal pain.   She  has dysphagia Genitourinary: as above. Musculoskeletal:  No neck pain, mild back pain Integumentary: No rash, pruritus, skin lesions Neurological: as above Psychiatric: Mild depression at this time.  Mild anxiety Endocrine: No palpitations, diaphoresis, change in appetite, change in weigh or increased thirst Hematologic/Lymphatic:  No anemia, purpura, petechiae. Allergic/Immunologic: No itchy/runny eyes, nasal congestion, recent allergic reactions, rashes  ALLERGIES: Allergies  Allergen Reactions  . Erythromycin Nausea And Vomiting  .  Oxycodone-Acetaminophen     Other reaction(s): Hallucinations  . Sulfa Antibiotics     HOME MEDICATIONS:  Current outpatient prescriptions:  .  alendronate (FOSAMAX) 10 MG tablet, 70 mg., Disp: , Rfl:  .  Calcium Carbonate-Vitamin D 600-125 MG-UNIT TABS, Take by mouth., Disp: , Rfl:  .  cetirizine (ZYRTEC) 1 MG/ML syrup, Take 5 mg by mouth., Disp: , Rfl:  .  desmopressin (DDAVP) 0.1 MG tablet, Take 0.1 mg by mouth., Disp: , Rfl:  .  diazepam (VALIUM) 5 MG tablet, Take 5 mg by mouth., Disp: , Rfl:  .  famciclovir (FAMVIR) 250 MG tablet, TAKE 1 TABLET BY MOUTH TWICE A DAY, Disp: , Rfl:  .  FLUARIX QUADRIVALENT 0.5 ML injection, , Disp: , Rfl: 0 .  gabapentin (NEURONTIN) 600 MG tablet, Take 600 mg by mouth., Disp: , Rfl:  .  methylphenidate (RITALIN) 10 MG tablet, Take 20 mg by mouth., Disp: , Rfl:  .  mirabegron ER (MYRBETRIQ) 50 MG TB24 tablet, Take 50 mg by mouth., Disp: , Rfl:  .  natalizumab 300 mg in sodium chloride 0.9 % 100 mL, Inject into the vein., Disp: , Rfl:  .  omeprazole (PRILOSEC) 20 MG capsule, 2 (two) times daily., Disp: , Rfl:  .  ranitidine (ZANTAC) 150 MG tablet, Take 150 mg by mouth., Disp: , Rfl:  .  simvastatin (ZOCOR) 40 MG tablet, TAKE 1 TABLET AT BEDTIME FOR CHOLESTEROL, Disp: , Rfl:  .  venlafaxine XR (EFFEXOR-XR) 150 MG 24 hr capsule, Take 150 mg by mouth., Disp: , Rfl:  .  nitrofurantoin (MACRODANTIN) 100 MG capsule, Take 100 mg by mouth., Disp: , Rfl:  .  oxybutynin (DITROPAN-XL) 10 MG 24 hr tablet, Take 10 mg by mouth., Disp: , Rfl:    PAST MEDICAL HISTORY: Past Medical History  Diagnosis Date  . Chronic kidney disease   . Multiple sclerosis   . Neuropathy   . Vision abnormalities     PAST SURGICAL HISTORY: Past Surgical History  Procedure Laterality Date  . Hysterctomy    . Hip fracture surgery    . Carpal tunnel release Right     2010  . Carpal tunnel release Left   . Endometrial ablation      2007  . Cesarean section    . Tubal ligation       FAMILY HISTORY: Family History  Problem Relation Age of Onset  . Lung cancer Mother   . Dementia Father     SOCIAL HISTORY:  History   Social History  . Marital Status: Legally Separated    Spouse Name: N/A    Number of Children: N/A  . Years of Education: N/A   Occupational History  . Not on file.   Social History Main Topics  . Smoking status: Former Smoker    Quit date: 03/29/1981  . Smokeless tobacco: Not on file  . Alcohol Use: 0.0 oz/week    0 Not specified per week     Comment: Occasional  . Drug Use: No  . Sexual Activity: Not on file  Other Topics Concern  . Not on file   Social History Narrative  . No narrative on file     PHYSICAL EXAM  Filed Vitals:   03/29/14 1437  BP: 140/80  Pulse: 72  Resp: 14  Height: 5\' 3"  (1.6 m)  Weight: 136 lb 6.4 oz (61.871 kg)    Body mass index is 24.17 kg/(m^2).   General: The patient is well-developed and well-nourished and in no acute distress  Eyes:  Funduscopic exam shows normal optic discs and retinal vessels.  Neck: The neck is supple, no carotid bruits are noted.  The neck is nontender.  Respiratory: The respiratory examination is clear.  Cardiovascular: The cardiovascular examination reveals a regular rate and rhythm, no murmurs, gallops or rubs are noted.  Skin: Extremities are without significant edema.  Neurologic Exam  Mental status: The patient is alert and oriented x 3 at the time of the examination. The patient has apparent normal recent and remote memory, with an apparently normal attention span and concentration ability.   Speech is normal.  Cranial nerves: Extraocular movements are full. Pupils are equal, round, and reactive to light and accomodation.  Visual fields are full.  Facial symmetry is present. There is good facial sensation to soft touch bilaterally.Facial strength is normal.  Trapezius and sternocleidomastoid strength is normal. No dysarthria is noted.  The tongue is  midline, and the patient has symmetric elevation of the soft palate. No obvious hearing deficits are noted.  Motor:  Muscle bulk is normal.    Tone is increased in both legs. Strength is  5 / 5 in all 4 extremities.   Sensory: Sensory testing is intact to pinprick, soft touch, vibration sensation, and position sense on all 4 extremities.  Coordination: Cerebellar testing reveals good finger-nose-finger and mildly reduced heel-to-shin bilaterally.  Gait and station: Station is normal and gait is spastic. Tandem gait is mildly wide. Romberg is negative.   Reflexes: Deep tendon reflexes are 3+ bilaterally in legs. Plantar responses are normal.    DIAGNOSTIC DATA (LABS, IMAGING, TESTING) - I reviewed patient records, labs, notes, testing and imaging myself where available.  Lab Results  Component Value Date   HGB 12.0 10/09/2010      Component Value Date/Time   PROT 6.7 09/29/2007 0837   ALBUMIN 4.1 09/29/2007 0837   AST 34 09/29/2007 0837   ALT 30 09/29/2007 0837   ALKPHOS 88 09/29/2007 0837   BILITOT 1.1 09/29/2007 0837   Lab Results  Component Value Date   CHOL 136 09/29/2007   HDL 41.5 09/29/2007   LDLCALC 69 09/29/2007   TRIG 126 09/29/2007   CHOLHDL 3.3 CALC 09/29/2007   No results found for: HGBA1C No results found for: VITAMINB12 No results found for: TSH No components found for: VITAMIND     ASSESSMENT AND PLAN  DS (disseminated sclerosis) - Plan: natalizumab (TYSABRI) 300 mg in sodium chloride 0.9 % 100 mL IVPB, diphenhydrAMINE (BENADRYL) injection 25 mg, methylPREDNISolone sodium succinate (SOLU-MEDROL) 500 mg in sodium chloride 0.9 % 100 mL IVPB  Abnormality of gait  OP (osteoporosis)  Clinical depression  History of urinary anomaly  Bladder neurogenesis  High risk medication use - Plan: natalizumab (TYSABRI) 300 mg in sodium chloride 0.9 % 100 mL IVPB, diphenhydrAMINE (BENADRYL) injection 25 mg, methylPREDNISolone sodium succinate (SOLU-MEDROL) 500  mg in sodium chloride 0.9 % 100 mL IVPB  In summary, Lerlene Peach is a 54 year old woman with multiple sclerosis who is been fairly stable on Tysabri therapy. She  wishes to continue on that medication for the time being. We have reviewed the risks of Tysabri including a chance of developing PML. We'll set up an infusion at an outpatient facility for her. She is advised to remain active as she is doing. She will continue to do intermittent catheterization for her bladder dysfunction  She will return to see me in 3 or 4 months, sooner if she has new or worsening neurologic symptoms. 45 minuteswas spent face-to-face with greater than half the time and coordinating care about her multiple sclerosis.    Cedar Roseman A. Epimenio Foot, MD, PhD 03/29/2014, 3:04 PM Certified in Neurology, Clinical Neurophysiology, Sleep Medicine, Pain Medicine and Neuroimaging  [facility] [address] [place], [place] [address] [phone]  ADDENDUM:   More records from [facility] neurology became available. I reviewed the results of MRIs. An MRI of the spinal cord dated 02/02/2013 shows demyelinating plaque to the left adjacent to C3, anteriorly adjacent to C4, to the right adjacent to C5-C6, posteriorly to the right adjacent to C6-C7. When that study was compared to an MRI dated 12/29/2006, there was no definite interval change. MRI of the brain the same day was abnormal showing signal changes compatible with the diagnosis of multiple sclerosis. Compared to the prior study dated 02/23/2012, there was no definite interval change. JCV titer from 11/28/2013 was negative at 0.15.

## 2014-03-30 ENCOUNTER — Other Ambulatory Visit (HOSPITAL_COMMUNITY): Payer: Self-pay | Admitting: Neurology

## 2014-03-30 ENCOUNTER — Telehealth: Payer: Self-pay | Admitting: Neurology

## 2014-03-30 ENCOUNTER — Encounter (HOSPITAL_COMMUNITY): Admission: RE | Admit: 2014-03-30 | Payer: Medicare Other | Source: Ambulatory Visit

## 2014-03-30 NOTE — Telephone Encounter (Signed)
Spoke with Kellie [name] at [facility] short stay.  Kellie [name] needed to have her infusion site transferred from [facility] to [facility] short stay.  I have done this.  Spoke with Kellie [name] and let her know this has been done./fim

## 2014-03-30 NOTE — Telephone Encounter (Signed)
Patient calling again and states she will not leave [facility] until she hears from our office regarding Infusion.  Please call and advise.

## 2014-03-30 NOTE — Telephone Encounter (Signed)
Dee @ [facility] is calling regarding patient. Patient has appointment there today at 11:00 and Geraldine Contras has questions about authorization for Tsabri. Please call.

## 2014-03-30 NOTE — Telephone Encounter (Signed)
Patient is calling because she is very upset because she cannot get Tsabri today. Please call patient.

## 2014-04-06 ENCOUNTER — Encounter (HOSPITAL_COMMUNITY)
Admission: RE | Admit: 2014-04-06 | Discharge: 2014-04-06 | Disposition: A | Discharge status: Home / Self Care | Payer: Medicare Other | Source: Ambulatory Visit | Attending: Neurology | Admitting: Neurology

## 2014-04-06 ENCOUNTER — Encounter (HOSPITAL_COMMUNITY): Payer: Self-pay

## 2014-04-06 VITALS — BP 125/72 | HR 65 | Temp 98.3°F | Resp 20 | Ht 63.0 in | Wt 136.2 lb

## 2014-04-06 DIAGNOSIS — Z79899 Other long term (current) drug therapy: Secondary | ICD-10-CM | POA: Insufficient documentation

## 2014-04-06 DIAGNOSIS — G35 Multiple sclerosis: Secondary | ICD-10-CM | POA: Insufficient documentation

## 2014-04-06 MED ORDER — SODIUM CHLORIDE 0.9 % IV SOLN
INTRAVENOUS | Status: DC
  Administered 2014-04-06: 12:00:00 via INTRAVENOUS

## 2014-04-06 MED ORDER — SODIUM CHLORIDE 0.9 % IV SOLN
300.0000 mg | INTRAVENOUS | Status: DC
  Administered 2014-04-06: 300 mg via INTRAVENOUS
  Filled 2014-04-06: qty 15

## 2014-04-06 NOTE — Discharge Instructions (Signed)

## 2014-05-05 ENCOUNTER — Encounter (HOSPITAL_COMMUNITY): Payer: Self-pay

## 2014-05-05 ENCOUNTER — Encounter (HOSPITAL_COMMUNITY)
Admission: RE | Admit: 2014-05-05 | Discharge: 2014-05-05 | Disposition: A | Discharge status: Home / Self Care | Payer: Medicare Other | Source: Ambulatory Visit | Attending: Neurology | Admitting: Neurology

## 2014-05-05 VITALS — BP 149/60 | HR 67 | Temp 98.2°F | Resp 16

## 2014-05-05 DIAGNOSIS — Z79899 Other long term (current) drug therapy: Secondary | ICD-10-CM | POA: Insufficient documentation

## 2014-05-05 DIAGNOSIS — G35 Multiple sclerosis: Secondary | ICD-10-CM | POA: Diagnosis present

## 2014-05-05 HISTORY — DX: Other complications of anesthesia, initial encounter: T88.59XA

## 2014-05-05 HISTORY — DX: Adverse effect of unspecified anesthetic, initial encounter: T41.45XA

## 2014-05-05 MED ORDER — SODIUM CHLORIDE 0.9 % IV SOLN
INTRAVENOUS | Status: DC
  Administered 2014-05-05: 12:00:00 via INTRAVENOUS

## 2014-05-05 MED ORDER — SODIUM CHLORIDE 0.9 % IV SOLN
300.0000 mg | INTRAVENOUS | Status: DC
  Administered 2014-05-05: 300 mg via INTRAVENOUS
  Filled 2014-05-05: qty 15

## 2014-05-05 NOTE — Progress Notes (Signed)
Patient states she has a little headache. Doesn't want to call MD or tylenol. States she had advil at 11a and she was going to repeat when she got home at 3p. Says she always gets headaches after tysabri

## 2014-05-10 ENCOUNTER — Encounter: Payer: Self-pay | Admitting: *Deleted

## 2014-05-25 ENCOUNTER — Other Ambulatory Visit: Payer: Self-pay | Admitting: Neurology

## 2014-06-01 ENCOUNTER — Other Ambulatory Visit: Payer: Self-pay | Admitting: Neurology

## 2014-06-01 ENCOUNTER — Other Ambulatory Visit: Payer: Self-pay | Admitting: *Deleted

## 2014-06-01 MED ORDER — DIAZEPAM 5 MG PO TABS: 5.0000 mg | ORAL_TABLET | Freq: Every evening | ORAL | Status: DC | PRN

## 2014-06-01 MED ORDER — DIAZEPAM 5 MG PO TABS: 5.0000 mg | ORAL_TABLET | Freq: Every day | ORAL | Status: DC

## 2014-06-01 MED ORDER — METHYLPHENIDATE HCL 10 MG PO TABS: 10.0000 mg | ORAL_TABLET | Freq: Four times a day (QID) | ORAL | Status: DC

## 2014-06-01 NOTE — Telephone Encounter (Signed)
Ritalin and Diazepam r/f/fim

## 2014-06-02 ENCOUNTER — Encounter (HOSPITAL_COMMUNITY): Payer: Medicare Other

## 2014-06-06 ENCOUNTER — Encounter: Payer: Self-pay | Admitting: *Deleted

## 2014-06-07 ENCOUNTER — Other Ambulatory Visit: Payer: Self-pay | Admitting: *Deleted

## 2014-06-07 MED ORDER — NITROFURANTOIN MACROCRYSTAL 100 MG PO CAPS: 100.0000 mg | ORAL_CAPSULE | Freq: Every day | ORAL | Status: DC

## 2014-06-07 MED ORDER — GABAPENTIN 600 MG PO TABS: ORAL_TABLET | ORAL | Status: DC

## 2014-06-07 NOTE — Telephone Encounter (Signed)
Gabapentin and Nitrofurantoin r/f per faxed request from [facility]/fim

## 2014-06-15 ENCOUNTER — Telehealth: Payer: Self-pay | Admitting: Neurology

## 2014-06-15 NOTE — Telephone Encounter (Signed)
Pt is calling stating we need to call [phone] [facility] for a qty limit exception for methylphenidate (RITALIN) 10 MG tablet rx.  Please advise.

## 2014-06-15 NOTE — Telephone Encounter (Signed)
Ins is requesting Ins ID# in order to proceed with request.  I called the patient.  She provided ID # [id number].  Ins has been contacted and provided with clinical info.  Request is under review Ref Key: [id number].  Patient is aware.

## 2014-06-16 ENCOUNTER — Telehealth: Payer: Self-pay

## 2014-06-16 NOTE — Telephone Encounter (Signed)
[facility]) has approved the request for coverage on Methylphenidate effective until 03/11/2015 Ref # [id number]

## 2014-06-30 ENCOUNTER — Encounter: Payer: Self-pay | Admitting: *Deleted

## 2014-06-30 ENCOUNTER — Encounter (HOSPITAL_COMMUNITY): Payer: Medicare Other

## 2014-07-14 ENCOUNTER — Encounter: Payer: Self-pay | Admitting: *Deleted

## 2014-07-15 ENCOUNTER — Telehealth: Payer: Self-pay | Admitting: Neurology

## 2014-07-15 NOTE — Telephone Encounter (Signed)
LMTC.  I advised our office does close at noon on Fridays; if she feels she needs immed. help, to call on-call doc, if questions can wait, to call me back on Monday morning/fim

## 2014-07-15 NOTE — Telephone Encounter (Signed)
Patient called and stated that she is experiencing some issues and would like to speak with someone regarding these issues. Please call and advise.

## 2014-07-18 MED ORDER — HYOSCYAMINE SULFATE 0.125 MG PO TABS: 0.1250 mg | ORAL_TABLET | Freq: Three times a day (TID) | ORAL | Status: DC | PRN

## 2014-07-18 NOTE — Telephone Encounter (Signed)
I have spoken with Kellie [name] this afternoon--she c/o stool incontinence, loose stool, onset 1 and 1/2 weeks ago.  Sts. she feels a heaviness in her rectum that is new for her. She has soiled herself several times, is now having to wear Depends.  She has a gi in [place] and sts. it is time for her colonoscopy.  I advised I will check with RAS and call her back this afternoon/fim

## 2014-07-18 NOTE — Telephone Encounter (Signed)
Patient called/returning Faith's call from 07/15/14. Patient can be reached @ [phone]

## 2014-07-18 NOTE — Telephone Encounter (Signed)
I spoke with Kellie [name] again this afternoon, and per RAS, offered Hyoscamine 0.125mg  po bid-tid prn, and advised she f/u with GI.   She verbalized understanding of same--[facility] per her request.  She sts. she has an appt. with GI on Wednesday/fim

## 2014-07-19 ENCOUNTER — Other Ambulatory Visit: Payer: Self-pay | Admitting: Neurology

## 2014-07-25 ENCOUNTER — Telehealth: Payer: Self-pay

## 2014-07-25 MED ORDER — DESMOPRESSIN ACETATE 0.1 MG PO TABS: 0.1000 mg | ORAL_TABLET | Freq: Every day | ORAL | Status: DC

## 2014-07-25 NOTE — Telephone Encounter (Signed)
Desmopressin escribed to [facility] as requested/fim

## 2014-07-25 NOTE — Telephone Encounter (Signed)
[facility]ill on Desmopressin 0.1mg  tabs with instructions of 1 tablet daily.  Thank you.

## 2014-07-26 ENCOUNTER — Telehealth: Payer: Self-pay

## 2014-07-26 HISTORY — PX: COLONOSCOPY: SHX174

## 2014-07-26 MED ORDER — ALENDRONATE SODIUM 10 MG PO TABS: 70.0000 mg | ORAL_TABLET | ORAL | Status: DC

## 2014-07-26 NOTE — Telephone Encounter (Signed)
Alendronate rx. escribed to [facility] [place] per request/fim

## 2014-07-26 NOTE — Telephone Encounter (Signed)
[name] is requesting a refill on Alendronate Sodium  Tabs with instructions Take 1 tablet once per week with 8 oz of water, stay upright for 30 minutes, then eat breakfast.  (They were previously sending request to [facility] in error)  Thank you.

## 2014-07-27 ENCOUNTER — Encounter: Payer: Self-pay | Admitting: *Deleted

## 2014-07-27 ENCOUNTER — Other Ambulatory Visit: Payer: Self-pay | Admitting: *Deleted

## 2014-07-27 DIAGNOSIS — Z79899 Other long term (current) drug therapy: Secondary | ICD-10-CM

## 2014-07-27 DIAGNOSIS — G35 Multiple sclerosis: Secondary | ICD-10-CM

## 2014-07-28 ENCOUNTER — Encounter (HOSPITAL_COMMUNITY): Payer: Medicare Other

## 2014-08-02 ENCOUNTER — Telehealth: Payer: Self-pay | Admitting: Neurology

## 2014-08-02 NOTE — Telephone Encounter (Signed)
Patient called and requested to speak with Faith RN. She would like to know if she can have an MRI conducted tomorrow while she is here for her appt. Please call and advise.

## 2014-08-02 NOTE — Telephone Encounter (Signed)
I have spoken with Kellie [name] this afternoon.  She sts. she had a colonoscopy  one week ago today, and has not had a bm since then.  She wonders if this is indicative of an MS exacerbation.  Per RAS, I have advised this is not likely.  She has an appt. with RAS tomorrow and will discuss in greater detail.  She is not sure when her last mri was.  She sts. her right leg did give way in dance class once this week and she would like to discuss this as well/fim

## 2014-08-03 ENCOUNTER — Ambulatory Visit: Payer: Medicare Other | Admitting: Neurology

## 2014-08-03 ENCOUNTER — Encounter: Payer: Self-pay | Admitting: Neurology

## 2014-08-03 ENCOUNTER — Encounter (INDEPENDENT_AMBULATORY_CARE_PROVIDER_SITE_OTHER): Payer: Self-pay

## 2014-08-03 ENCOUNTER — Ambulatory Visit (INDEPENDENT_AMBULATORY_CARE_PROVIDER_SITE_OTHER): Payer: Medicare Other | Admitting: Neurology

## 2014-08-03 VITALS — BP 122/81 | HR 73 | Ht 63.0 in | Wt 136.4 lb

## 2014-08-03 DIAGNOSIS — R269 Unspecified abnormalities of gait and mobility: Secondary | ICD-10-CM

## 2014-08-03 DIAGNOSIS — E781 Pure hyperglyceridemia: Secondary | ICD-10-CM | POA: Insufficient documentation

## 2014-08-03 DIAGNOSIS — F329 Major depressive disorder, single episode, unspecified: Secondary | ICD-10-CM | POA: Diagnosis not present

## 2014-08-03 DIAGNOSIS — Z87448 Personal history of other diseases of urinary system: Secondary | ICD-10-CM | POA: Diagnosis not present

## 2014-08-03 DIAGNOSIS — R5383 Other fatigue: Secondary | ICD-10-CM

## 2014-08-03 DIAGNOSIS — G35 Multiple sclerosis: Secondary | ICD-10-CM | POA: Diagnosis not present

## 2014-08-03 DIAGNOSIS — R200 Anesthesia of skin: Secondary | ICD-10-CM | POA: Diagnosis not present

## 2014-08-03 DIAGNOSIS — K5900 Constipation, unspecified: Secondary | ICD-10-CM | POA: Insufficient documentation

## 2014-08-03 DIAGNOSIS — K5909 Other constipation: Secondary | ICD-10-CM

## 2014-08-03 DIAGNOSIS — G35D Multiple sclerosis, unspecified: Secondary | ICD-10-CM

## 2014-08-03 DIAGNOSIS — F32A Depression, unspecified: Secondary | ICD-10-CM

## 2014-08-03 DIAGNOSIS — Z87718 Personal history of other specified (corrected) congenital malformations of genitourinary system: Secondary | ICD-10-CM

## 2014-08-03 MED ORDER — METHYLPHENIDATE HCL 10 MG PO TABS: 10.0000 mg | ORAL_TABLET | Freq: Four times a day (QID) | ORAL | Status: DC

## 2014-08-03 MED ORDER — DIAZEPAM 5 MG PO TABS: 5.0000 mg | ORAL_TABLET | Freq: Every evening | ORAL | Status: DC | PRN

## 2014-08-03 MED ORDER — ALENDRONATE SODIUM 70 MG PO TABS: 70.0000 mg | ORAL_TABLET | ORAL | Status: DC

## 2014-08-03 NOTE — Progress Notes (Signed)
[facility]  PATIENT: Kellie [name] DOB: 18-Mar-1960  REFERRING CLINICIAN: Vernona Rieger HISTORY FROM: Patient  REASON FOR VISIT: Multiple Sclerosis   HISTORICAL  CHIEF COMPLAINT:  Chief Complaint  Patient presents with  . Follow-up    Alendronate Rx incorrect, Costipation, Scar or left leg change, Bil hip pain, No BM in over a week     HISTORY OF PRESENT ILLNESS:  Kellie [name] is a 54 year old woman with MS diagnoses in 1997.   She continues on Tysabri therapy. Last JCV 07/25/2014 was negative.  She had a colonoscopy 8 days ago and has yet to have a BM.   She wonders if it is due to her MS.    She has been taking Miralax.   She denies any abdominal pain or nausea.   She has been eating her regular diet.   She is on oxybutynin XR 10  Bid but prefers not to hold it as it helps her bladder a lot --  She will cut the dose down to once a day.    Despite gait issues, she stays active and exercises.     Gait/strength/sensation:  She has noted more difficulty with her gait and is having more difficulty doing exercises in [place].   Is noted that the right leg seems weaker and it is giving out more.   She reports tingling in the arms and legs. The right side is affected more than the left. The tingling is usually not too painful.  She is on gabapentin which she feels has helped some of the uncomfortable aspects of the tingling. She tolerates that well. She had her gait and balance generally do well but she does notice some decline when she gets hot or if she does a lot of exercise. She does not note significant weakness in the legs.   However, she has spasticity in her legs.   Gait issues are worse when she goes longer distances, when tired or when hot.      Bladder:   She has had a lot of difficulties with urinary urgency and frequency.  She does intermittent catheterization up to 10 times a day. She is currently on oxybutynin combined with Myrbetriq which have helped the  bladder spasms. She denies any recent urinary tract infections.   She takes macrodantin bid.     She notes difficulty with dysphagia.  This seems to be multifactorial with a combination of dryness in her mouth (from med's?), Clinical changes (she had her esophagus stretched in the past) and possibly from the MS.  She has fatigue but she remains active. She does Jazzercise 5 days a week over 8 different classes. She feels better when she does her exercises.  She has had depression in the past but is generally feeling good currently.   She has mild anxiety that does not bother her too much.     She has not noted any significant cognitive issues and has not noted problems with her memory or speech.  She sleeps well as Zee as she takes the Valium with desmopressin every night.  She is now able to fall asleep easily and stays asleep most of the night. With the desmopressin she doesn't have to wake up to catheterize herself or because of incontinence.  MS History:  She presented with left sided numbness in September 1997.   She underwent an MRI and a lumbar puncture and was told MS was unlikely.   She had right sided numbness in January 1998  and had brain and spine MRI.    There were changes suggestive of MS and she was diagnosed in January 1998.   She was started on Betaseron.   She was on Betaseron x 10 years and then switched to Avonex for a year due to tolerability issues.   She had clinical exacerbations and MRI changes and then went on Tysabri in 2007 or 2008 and has had 92 infusions, her last infusion being 03/02/15.    She is transferring care from [facility] to [facility]  She tolerates Tysabri well.   She is JCV Ab negative.     An MRI of the spinal cord dated 02/02/2013 shows demyelinating plaque to the left adjacent to C3, anteriorly adjacent to C4, to the right adjacent to C5-C6, posteriorly to the right adjacent to C6-C7. When that study was compared to an MRI dated 12/29/2006, there was no  definite interval change. MRI of the brain the same day was abnormal showing signal changes compatible with the diagnosis of multiple sclerosis. Compared to the prior study dated 02/23/2012, there was no definite interval change.     REVIEW OF SYSTEMS:  Constitutional: No fevers, chills, sweats, or change in appetite Eyes: No visual changes, double vision, eye pain Ear, nose and throat: No hearing loss, ear pain, nasal congestion, sore throat Cardiovascular: No chest pain, palpitations Respiratory:  No shortness of breath at rest or with exertion.   No wheezes GastrointestinaI: No nausea, vomiting, diarrhea, abdominal pain.   She has dysphagia Genitourinary: as above. Musculoskeletal:  No neck pain, mild back pain Integumentary: No rash, pruritus, skin lesions Neurological: as above Psychiatric: Mild depression at this time.  Mild anxiety Endocrine: No palpitations, diaphoresis, change in appetite, change in weigh or increased thirst Hematologic/Lymphatic:  No anemia, purpura, petechiae. Allergic/Immunologic: No itchy/runny eyes, nasal congestion, recent allergic reactions, rashes  ALLERGIES: Allergies  Allergen Reactions  . Erythromycin Nausea And Vomiting  . Oxycodone-Acetaminophen     Other reaction(s): Hallucinations  . Clindamycin/Lincomycin   . Sulfa Antibiotics     HOME MEDICATIONS:  Current outpatient prescriptions:  .  alendronate (FOSAMAX) 70 MG tablet, Take 70 mg by mouth once a week., Disp: , Rfl: 8 .  Calcium Carbonate-Vitamin D 600-125 MG-UNIT TABS, Take by mouth., Disp: , Rfl:  .  cetirizine (ZYRTEC) 1 MG/ML syrup, Take 5 mg by mouth., Disp: , Rfl:  .  desmopressin (DDAVP) 0.1 MG tablet, Take 1 tablet (0.1 mg total) by mouth daily., Disp: 30 tablet, Rfl: 11 .  diazepam (VALIUM) 5 MG tablet, Take 1 tablet (5 mg total) by mouth at bedtime as needed for anxiety., Disp: 30 tablet, Rfl: 3 .  doxycycline (VIBRAMYCIN) 100 MG capsule, , Disp: , Rfl: 0 .  famciclovir  (FAMVIR) 250 MG tablet, TAKE 1 TABLET BY MOUTH TWICE A DAY, Disp: , Rfl:  .  gabapentin (NEURONTIN) 600 MG tablet, Take 2 tablets 4 times daily, Disp: 240 tablet, Rfl: 11 .  lactobacillus acidophilus (BACID) TABS tablet, Take 1 tablet by mouth 1 day or 1 dose., Disp: , Rfl:  .  methylphenidate (RITALIN) 10 MG tablet, Take 1 tablet (10 mg total) by mouth 4 (four) times daily., Disp: 120 tablet, Rfl: 0 .  mirabegron ER (MYRBETRIQ) 50 MG TB24 tablet, Take 50 mg by mouth., Disp: , Rfl:  .  Multiple Vitamin (MULTIVITAMIN) capsule, Take 1 capsule by mouth daily., Disp: , Rfl:  .  natalizumab 300 mg in sodium chloride 0.9 % 100 mL, Inject into the vein.,  Disp: , Rfl:  .  nitrofurantoin (MACRODANTIN) 100 MG capsule, Take 1 capsule (100 mg total) by mouth at bedtime., Disp: 30 capsule, Rfl: 3 .  omega-3 acid ethyl esters (LOVAZA) 1 G capsule, Take by mouth 2 (two) times daily., Disp: , Rfl:  .  omeprazole (PRILOSEC) 20 MG capsule, 2 (two) times daily., Disp: , Rfl:  .  oxybutynin (DITROPAN-XL) 10 MG 24 hr tablet, Take 10 mg by mouth., Disp: , Rfl:  .  polyethylene glycol powder (GLYCOLAX/MIRALAX) powder, , Disp: , Rfl: 9 .  ranitidine (ZANTAC) 150 MG tablet, Take 150 mg by mouth., Disp: , Rfl:  .  simvastatin (ZOCOR) 40 MG tablet, TAKE 1 TABLET AT BEDTIME FOR CHOLESTEROL, Disp: , Rfl:  .  venlafaxine XR (EFFEXOR-XR) 150 MG 24 hr capsule, TAKE 2 CAPSULES BY MOUTH EVERY DAY, Disp: 60 capsule, Rfl: 5 .  vitamin B-12 (CYANOCOBALAMIN) 100 MCG tablet, Take 100 mcg by mouth daily., Disp: , Rfl:  .  alendronate (FOSAMAX) 10 MG tablet, Take 7 tablets (70 mg total) by mouth once a week. (Patient not taking: Reported on 08/03/2014), Disp: 7 tablet, Rfl: 3 .  diazepam (VALIUM) 5 MG tablet, Take 1 tablet (5 mg total) by mouth at bedtime. (Patient not taking: Reported on 08/03/2014), Disp: 30 tablet, Rfl: 5 .  FLUARIX QUADRIVALENT 0.5 ML injection, , Disp: , Rfl: 0 .  hyoscyamine (LEVSIN, ANASPAZ) 0.125 MG tablet, Take 1  tablet (0.125 mg total) by mouth 3 (three) times daily as needed., Disp: 90 tablet, Rfl: 3 .  nitrofurantoin (MACRODANTIN) 100 MG capsule, , Disp: , Rfl:    PAST MEDICAL HISTORY: Past Medical History  Diagnosis Date  . Chronic kidney disease   . Multiple sclerosis   . Neuropathy   . Vision abnormalities   . Complication of anesthesia     pt states she got thrush after anesthesia    PAST SURGICAL HISTORY: Past Surgical History  Procedure Laterality Date  . Hysterctomy    . Hip fracture surgery    . Carpal tunnel release Right     2010  . Carpal tunnel release Left   . Endometrial ablation      2007  . Cesarean section    . Tubal ligation    . Colonoscopy  07/26/14    FAMILY HISTORY: Family History  Problem Relation Age of Onset  . Lung cancer Mother   . Dementia Father     SOCIAL HISTORY:  History   Social History  . Marital Status: Legally Separated    Spouse Name: N/A  . Number of Children: N/A  . Years of Education: N/A   Occupational History  . Not on file.   Social History Main Topics  . Smoking status: Former Smoker    Quit date: 03/29/1981  . Smokeless tobacco: Not on file  . Alcohol Use: 0.0 oz/week    0 Standard drinks or equivalent per week     Comment: Occasional  . Drug Use: No  . Sexual Activity: Not on file   Other Topics Concern  . Not on file   Social History Narrative     PHYSICAL EXAM  Filed Vitals:   08/03/14 1128  BP: 122/81  Pulse: 73  Height: 5\' 3"  (1.6 m)  Weight: 136 lb 6.4 oz (61.871 kg)    Body mass index is 24.17 kg/(m^2).   General: The patient is well-developed and well-nourished and in no acute distress  Skin: Extremities are without significant edema.   She  has a nodule outer thigh on the right.  She thinks at sight of Betaseron injection  Neurologic Exam  Mental status: The patient is alert and oriented x 3 at the time of the examination. The patient has apparent normal recent and remote memory, with  an apparently normal attention span and concentration ability.   Speech is normal.  Cranial nerves: Extraocular movements are full. Pupils are equal, round, and reactive to light and accomodation.  Visual fields are full.  Facial symmetry is present. There is good facial sensation to soft touch bilaterally.Facial strength is normal.  Trapezius and sternocleidomastoid strength is normal. No dysarthria is noted.  The tongue is midline, and the patient has symmetric elevation of the soft palate. No obvious hearing deficits are noted.  Motor:  Muscle bulk is normal.    Tone is increased in both legs. Strength is  5 / 5 in all 4 extremities.   Sensory: Sensory testing is intact to pinprick, soft touch, vibration sensation, and position sense on all 4 extremities.  Coordination: Cerebellar testing reveals good finger-nose-finger and mildly reduced heel-to-shin bilaterally.  Gait and station: Station is normal and gait is spastic and mildly wide. Tandem gait is wide. Romberg is negative.   Reflexes: Deep tendon reflexes are increased bilaterally in legs.  She has spread at the knees. Plantar responses are normal.    DIAGNOSTIC DATA (LABS, IMAGING, TESTING) - I reviewed patient records, labs, notes, testing and imaging myself where available.  Lab Results  Component Value Date   HGB 12.0 10/09/2010      Component Value Date/Time   PROT 6.7 09/29/2007 0837   ALBUMIN 4.1 09/29/2007 0837   AST 34 09/29/2007 0837   ALT 30 09/29/2007 0837   ALKPHOS 88 09/29/2007 0837   BILITOT 1.1 09/29/2007 0837   Lab Results  Component Value Date   CHOL 136 09/29/2007   HDL 41.5 09/29/2007   LDLCALC 69 09/29/2007   TRIG 126 09/29/2007   CHOLHDL 3.3 CALC 09/29/2007       ASSESSMENT AND PLAN  DS (disseminated sclerosis) - Plan: MR Brain W Wo Contrast, MR Cervical Spine W Wo Contrast  Abnormality of gait - Plan: MR Brain W Wo Contrast, MR Cervical Spine W Wo Contrast  Numbness - Plan: MR Brain W  Wo Contrast, MR Cervical Spine W Wo Contrast  History of urinary anomaly  Clinical depression  Other constipation  Other fatigue   1.   Due to more issues with gait and numbness, I will check an MRI of the brain with and without contrast in MRI of the cervical spine that we can assess the possibility of subclinical progression.  If there are significant changes, I will consider Lemtrada or Rituxan. 2. I do not think her MS is directly responsible for her severe constipation. However some of her medications may be worsening. She will cut down the oxybutynin to once a day from twice a day. MiraLAX. If she has not had a bowel movement in 2 day she will take Dulcolax and/or mag citrate. 3.  Refills were provided for her medications. 4.  Hot compresses to site on right leg. I think a small thrombosed vein might be more likely than an injection site as last Betaseron was many years ago.  She will return to see me in 3 or 4 months, sooner if she has new or worsening neurologic symptoms.   Kymberli Wiegand A. Epimenio Foot, MD, PhD 08/03/2014, 11:32 AM Certified in Neurology, Clinical Neurophysiology, Sleep Medicine, Pain  Medicine and Neuroimaging  [facility] [address], [place], [place] [address] [phone]

## 2014-08-04 ENCOUNTER — Ambulatory Visit
Admission: RE | Admit: 2014-08-04 | Discharge: 2014-08-04 | Disposition: A | Discharge status: Home / Self Care | Payer: Medicare Other | Source: Ambulatory Visit | Attending: Neurology | Admitting: Neurology

## 2014-08-04 DIAGNOSIS — R269 Unspecified abnormalities of gait and mobility: Secondary | ICD-10-CM

## 2014-08-04 DIAGNOSIS — G35 Multiple sclerosis: Secondary | ICD-10-CM

## 2014-08-04 DIAGNOSIS — R200 Anesthesia of skin: Secondary | ICD-10-CM

## 2014-08-04 MED ORDER — GADOBENATE DIMEGLUMINE 529 MG/ML IV SOLN
12.0000 mL | Freq: Once | INTRAVENOUS | Status: AC | PRN
  Administered 2014-08-04: 12 mL via INTRAVENOUS

## 2014-08-05 ENCOUNTER — Telehealth: Payer: Self-pay | Admitting: Neurology

## 2014-08-05 NOTE — Telephone Encounter (Signed)
Patient called wanting to know the results of her MRI on 08/04/14. Please call and advise. Patient can be reached @ [phone]

## 2014-08-05 NOTE — Telephone Encounter (Signed)
I have spoken with Kellie [name] this morning and advised I do not have mri results yet, but will call her as soon as I do/fim

## 2014-08-09 ENCOUNTER — Telehealth: Payer: Self-pay | Admitting: Neurology

## 2014-08-09 NOTE — Telephone Encounter (Signed)
I compared her new cervical spine MRI to be older MRI that she brought by. Things look stable and there are no new lesions

## 2014-08-09 NOTE — Telephone Encounter (Signed)
Patient returned call and wanted to follow up on the MRI results. I reiterated that Faith RN would call her with the results when they were available, she understood.

## 2014-08-10 NOTE — Telephone Encounter (Signed)
I have spoken with Kellie [name] this morning and per RAS, advised that he has compared mri's and there has been no change.  [name] verbalized understanding of same/fim

## 2014-08-10 NOTE — Telephone Encounter (Signed)
I have spoken with Kellie [name] this morning and per RAS, advised that recent mri c-spine showed no new lesions, no changes when compared with last mri c-spine.  She verbalized understanding of same/fim

## 2014-08-10 NOTE — Telephone Encounter (Signed)
I have spoken with Kellie [name] this morning and per RAS, advised that he has compared mri's of her cervical spine and there  has been no change. Kellie [name] verbalized understanding of same/fim

## 2014-08-10 NOTE — Telephone Encounter (Signed)
-----   Message from Asa Lente, MD sent at 08/08/2014  1:58 PM EDT ----- Please let her know that the MRI of the cervical spine shows 4 spots in spinal cord...    We got old brain MRI but not old cervical spine MRIs from [facility]..   Please request them and I'll compare and let her know result

## 2014-08-17 ENCOUNTER — Ambulatory Visit
Admission: RE | Admit: 2014-08-17 | Discharge: 2014-08-17 | Disposition: A | Discharge status: Home / Self Care | Payer: Medicare Other | Source: Ambulatory Visit | Attending: Neurology | Admitting: Neurology

## 2014-08-17 DIAGNOSIS — R200 Anesthesia of skin: Secondary | ICD-10-CM

## 2014-08-17 DIAGNOSIS — G35 Multiple sclerosis: Secondary | ICD-10-CM

## 2014-08-17 DIAGNOSIS — R269 Unspecified abnormalities of gait and mobility: Secondary | ICD-10-CM

## 2014-08-17 MED ORDER — GADOBENATE DIMEGLUMINE 529 MG/ML IV SOLN
12.0000 mL | Freq: Once | INTRAVENOUS | Status: AC | PRN
  Administered 2014-08-17: 12 mL via INTRAVENOUS

## 2014-08-19 ENCOUNTER — Telehealth: Payer: Self-pay | Admitting: *Deleted

## 2014-08-19 NOTE — Telephone Encounter (Signed)
-----   Message from Asa Lente, MD sent at 08/18/2014  7:45 PM EDT ----- Please let Niharika know the MRI of the brain shows no new lesions

## 2014-08-19 NOTE — Telephone Encounter (Signed)
LMOM (identifed vm) that per RAS, mri brain does not show any new lesions.  She does not need to return this call unless she has questions/fim

## 2014-08-25 ENCOUNTER — Encounter (HOSPITAL_COMMUNITY): Payer: Medicare Other

## 2014-08-26 ENCOUNTER — Encounter: Payer: Self-pay | Admitting: Neurology

## 2014-09-19 ENCOUNTER — Encounter: Payer: Self-pay | Admitting: *Deleted

## 2014-09-19 ENCOUNTER — Telehealth: Payer: Self-pay | Admitting: *Deleted

## 2014-09-19 MED ORDER — METHYLPHENIDATE HCL 10 MG PO TABS: 10.0000 mg | ORAL_TABLET | Freq: Four times a day (QID) | ORAL | Status: DC

## 2014-09-19 NOTE — Telephone Encounter (Signed)
Ritalin rx provided at appt for Tysabri infusion./fim

## 2014-09-22 ENCOUNTER — Encounter (HOSPITAL_COMMUNITY): Payer: Medicare Other

## 2014-10-08 ENCOUNTER — Other Ambulatory Visit: Payer: Self-pay | Admitting: Neurology

## 2014-10-18 ENCOUNTER — Encounter: Payer: Self-pay | Admitting: *Deleted

## 2014-11-08 ENCOUNTER — Other Ambulatory Visit: Payer: Self-pay | Admitting: Neurology

## 2014-11-15 ENCOUNTER — Other Ambulatory Visit: Payer: Self-pay | Admitting: *Deleted

## 2014-11-15 MED ORDER — DIAZEPAM 5 MG PO TABS: ORAL_TABLET | ORAL | Status: DC

## 2014-11-15 MED ORDER — DIAZEPAM 5 MG PO TABS: 5.0000 mg | ORAL_TABLET | Freq: Every evening | ORAL | Status: DC | PRN

## 2014-11-15 NOTE — Telephone Encounter (Signed)
Per pt. she is having more stress assoc. with family problems.  Per RAS, Valium r/f quantity increased to bid prn/fim

## 2014-11-28 ENCOUNTER — Other Ambulatory Visit: Payer: Self-pay | Admitting: *Deleted

## 2014-11-28 MED ORDER — VENLAFAXINE HCL ER 150 MG PO CP24: 300.0000 mg | ORAL_CAPSULE | Freq: Every day | ORAL | Status: DC

## 2014-11-28 NOTE — Telephone Encounter (Signed)
Effexor refilled per faxed request form [facility] in [place]/fim

## 2014-12-07 ENCOUNTER — Encounter: Payer: Self-pay | Admitting: Neurology

## 2014-12-07 ENCOUNTER — Ambulatory Visit (INDEPENDENT_AMBULATORY_CARE_PROVIDER_SITE_OTHER): Payer: Medicare Other | Admitting: Neurology

## 2014-12-07 VITALS — BP 132/80 | Resp 14 | Ht 63.0 in | Wt 139.4 lb

## 2014-12-07 DIAGNOSIS — N319 Neuromuscular dysfunction of bladder, unspecified: Secondary | ICD-10-CM | POA: Diagnosis not present

## 2014-12-07 DIAGNOSIS — M810 Age-related osteoporosis without current pathological fracture: Secondary | ICD-10-CM | POA: Diagnosis not present

## 2014-12-07 DIAGNOSIS — M7061 Trochanteric bursitis, right hip: Secondary | ICD-10-CM

## 2014-12-07 DIAGNOSIS — R5383 Other fatigue: Secondary | ICD-10-CM | POA: Diagnosis not present

## 2014-12-07 DIAGNOSIS — R269 Unspecified abnormalities of gait and mobility: Secondary | ICD-10-CM

## 2014-12-07 DIAGNOSIS — F329 Major depressive disorder, single episode, unspecified: Secondary | ICD-10-CM

## 2014-12-07 DIAGNOSIS — F32A Depression, unspecified: Secondary | ICD-10-CM

## 2014-12-07 DIAGNOSIS — G35 Multiple sclerosis: Secondary | ICD-10-CM | POA: Diagnosis not present

## 2014-12-07 DIAGNOSIS — R200 Anesthesia of skin: Secondary | ICD-10-CM

## 2014-12-07 MED ORDER — ALPRAZOLAM 0.25 MG PO TABS: 0.2500 mg | ORAL_TABLET | Freq: Every evening | ORAL | Status: DC | PRN

## 2014-12-07 MED ORDER — METHYLPHENIDATE HCL 10 MG PO TABS: 10.0000 mg | ORAL_TABLET | Freq: Four times a day (QID) | ORAL | Status: DC

## 2014-12-07 NOTE — Progress Notes (Signed)
[facility]  PATIENT: Kellie [name] DOB: 05-23-1960  REFERRING CLINICIAN: Vernona Rieger HISTORY FROM: Patient  REASON FOR VISIT: Multiple Sclerosis   HISTORICAL  CHIEF COMPLAINT:  Chief Complaint  Patient presents with  . Multiple Sclerosis    Sts. she continues to tolerate Tysabri well.  JCV ab last checked 07-27-14, and was negative at 0.19.  Sts. she is having more trouble  Sts. she is having more trouble having a full bowel movement.  Sts. sometimes has to digitally remove some stool.  Sts. stool is normal consistency.  She denies other new or worsening sx./fim    HISTORY OF PRESENT ILLNESS:  Wednesday Kellie [name] is a 54 year old woman with MS diagnoses in 1997.   She continues on Tysabri therapy and is JCV Ab (07/25/2014) negative.   She denies any new exacerbations though gait has worsened some in last couple years.    Since the last visit, she has had MRI brain and cervical spine (09/2014) showing 4 cervical spine foci and typical demyelination plaques in brain (brain unchanged compared to 2012).  Pain:    She has pain at the right hip. Laying on the right side increases the pain. This is a hip that she broke several years ago after a fall.  Gait/strength/sensation:  She has difficulty with her gait and has worsened some over the past couple years.  She has had a few falls (trips over dogs a lot).    Her the right leg seems weaker than the left (that is also hip that was fractured in the past due to a fall).  She reports mild chronic tingling in the arms and legs. The right side is affected more than the left. The tingling is usually not very painful and helped by gabapentin..   She tolerates that well.   She has mild weakness and spasticity in her legs.   Gait issues are worse when she is tired or when hot.      Bladder:   She has had a lot of difficulties with urinary urgency and frequency.  She does intermittent catheterization up to 10 times a day. She is currently on  oxybutynin combined with Myrbetriq which have helped the bladder spasms. She denies any recent urinary tract infections.   She takes macrodantin bid.     She has contipation.   With the desmopressin she doesn't have to wake up to catheterize herself or because of incontinence.  Swallow:   She notes dysphagia.  She notes dryness in her mouth, structural changes (she had her esophagus stretched in the past) and possibly from the MS.  She has fatigue but she remains active. She does Jazzercise 5 days a week over 8 different classes. She feels better when she does her exercises.  Mood:   She has had depression in the past but is generally feeling good currently.   She has mild anxiety that does not bother her too much.     She has not noted any significant cognitive issues and has not noted problems with her memory or speech.  Sleep:   She sleeps well as Gist as she takes the Valium with desmopressin every night.  She is now able to fall asleep easily and stays asleep most of the night.   She no longer wakes up at night to catheterize since going on desmopressin  MS History:  She presented with left sided numbness in September 1997.   She underwent an MRI and a lumbar puncture and was  told MS was unlikely.   She had right sided numbness in January 1998 and had brain and spine MRI.    There were changes suggestive of MS and she was diagnosed in January 1998.   She was started on Betaseron.   She was on Betaseron x 10 years and then switched to Avonex for a year due to tolerability issues.   She had clinical exacerbations and MRI changes and then went on Tysabri in 2007 or 2008 and has had 92 infusions, her last infusion being 03/02/15.    She is transferring care from [facility] to [facility].  She tolerates Tysabri well.   She is JCV Ab negative.     An MRI of the spinal cord dated 02/02/2013 shows demyelinating plaque to the left adjacent to C3, anteriorly adjacent to C4, to the right adjacent to C5-C6,  posteriorly to the right adjacent to C6-C7. When that study was compared to an MRI dated 12/29/2006, there was no definite interval change. MRI of the brain the same day was abnormal showing signal changes compatible with the diagnosis of multiple sclerosis. Compared to the prior study dated 02/23/2012, there was no definite interval change.     REVIEW OF SYSTEMS:  Constitutional: No fevers, chills, sweats, or change in appetite Eyes: No visual changes, double vision, eye pain Ear, nose and throat: No hearing loss, ear pain, nasal congestion, sore throat Cardiovascular: No chest pain, palpitations Respiratory:  No shortness of breath at rest or with exertion.   No wheezes GastrointestinaI: No nausea, vomiting, diarrhea, abdominal pain.   She has dysphagia Genitourinary: as above. Musculoskeletal:  No neck pain, mild back pain Integumentary: No rash, pruritus, skin lesions Neurological: as above Psychiatric: Mild depression at this time.  Mild anxiety Endocrine: No palpitations, diaphoresis, change in appetite, change in weigh or increased thirst Hematologic/Lymphatic:  No anemia, purpura, petechiae. Allergic/Immunologic: No itchy/runny eyes, nasal congestion, recent allergic reactions, rashes  ALLERGIES: Allergies  Allergen Reactions  . Erythromycin Nausea And Vomiting  . Oxycodone-Acetaminophen     Other reaction(s): Hallucinations  . Clindamycin/Lincomycin   . Sulfa Antibiotics     HOME MEDICATIONS:  Current outpatient prescriptions:  .  alendronate (FOSAMAX) 70 MG tablet, Take 1 tablet (70 mg total) by mouth once a week., Disp: 12 tablet, Rfl: 3 .  Calcium Carbonate-Vitamin D 600-125 MG-UNIT TABS, Take by mouth., Disp: , Rfl:  .  cetirizine (ZYRTEC) 1 MG/ML syrup, Take 5 mg by mouth., Disp: , Rfl:  .  desmopressin (DDAVP) 0.1 MG tablet, Take 1 tablet (0.1 mg total) by mouth daily., Disp: 30 tablet, Rfl: 11 .  diazepam (VALIUM) 5 MG tablet, Take one tablet twice daily as  needed., Disp: 60 tablet, Rfl: 1 .  famciclovir (FAMVIR) 250 MG tablet, TAKE 1 TABLET BY MOUTH TWICE A DAY, Disp: , Rfl:  .  FLUARIX QUADRIVALENT 0.5 ML injection, , Disp: , Rfl: 0 .  gabapentin (NEURONTIN) 600 MG tablet, Take 2 tablets 4 times daily, Disp: 240 tablet, Rfl: 11 .  methylphenidate (RITALIN) 10 MG tablet, Take 1 tablet (10 mg total) by mouth 4 (four) times daily., Disp: 120 tablet, Rfl: 0 .  mirabegron ER (MYRBETRIQ) 50 MG TB24 tablet, Take 50 mg by mouth., Disp: , Rfl:  .  Multiple Vitamin (MULTIVITAMIN) capsule, Take 1 capsule by mouth daily., Disp: , Rfl:  .  natalizumab 300 mg in sodium chloride 0.9 % 100 mL, Inject into the vein., Disp: , Rfl:  .  nitrofurantoin (MACRODANTIN) 100 MG  capsule, , Disp: , Rfl:  .  nitrofurantoin (MACRODANTIN) 100 MG capsule, TAKE 1 CAPSULE (100 MG TOTAL) BY MOUTH AT BEDTIME., Disp: 30 capsule, Rfl: 3 .  omega-3 acid ethyl esters (LOVAZA) 1 G capsule, Take by mouth 2 (two) times daily., Disp: , Rfl:  .  omeprazole (PRILOSEC) 20 MG capsule, 2 (two) times daily., Disp: , Rfl:  .  oxybutynin (DITROPAN-XL) 10 MG 24 hr tablet, Take 10 mg by mouth., Disp: , Rfl:  .  ranitidine (ZANTAC) 150 MG tablet, Take 150 mg by mouth., Disp: , Rfl:  .  simvastatin (ZOCOR) 40 MG tablet, TAKE 1 TABLET AT BEDTIME FOR CHOLESTEROL, Disp: , Rfl:  .  venlafaxine XR (EFFEXOR-XR) 150 MG 24 hr capsule, Take 2 capsules (300 mg total) by mouth daily., Disp: 60 capsule, Rfl: 5 .  vitamin B-12 (CYANOCOBALAMIN) 100 MCG tablet, Take 100 mcg by mouth daily., Disp: , Rfl:  .  doxycycline (VIBRAMYCIN) 100 MG capsule, , Disp: , Rfl: 0 .  lactobacillus acidophilus (BACID) TABS tablet, Take 1 tablet by mouth 1 day or 1 dose., Disp: , Rfl:  .  polyethylene glycol powder (GLYCOLAX/MIRALAX) powder, , Disp: , Rfl: 9   PAST MEDICAL HISTORY: Past Medical History  Diagnosis Date  . Chronic kidney disease   . Multiple sclerosis   . Neuropathy   . Vision abnormalities   . Complication of  anesthesia     pt states she got thrush after anesthesia    PAST SURGICAL HISTORY: Past Surgical History  Procedure Laterality Date  . Hysterctomy    . Hip fracture surgery    . Carpal tunnel release Right     2010  . Carpal tunnel release Left   . Endometrial ablation      2007  . Cesarean section    . Tubal ligation    . Colonoscopy  07/26/14    FAMILY HISTORY: Family History  Problem Relation Age of Onset  . Lung cancer Mother   . Dementia Father     SOCIAL HISTORY:  Social History   Social History  . Marital Status: Legally Separated    Spouse Name: N/A  . Number of Children: N/A  . Years of Education: N/A   Occupational History  . Not on file.   Social History Main Topics  . Smoking status: Former Smoker    Quit date: 03/29/1981  . Smokeless tobacco: Not on file  . Alcohol Use: 0.0 oz/week    0 Standard drinks or equivalent per week     Comment: Occasional  . Drug Use: No  . Sexual Activity: Not on file   Other Topics Concern  . Not on file   Social History Narrative     PHYSICAL EXAM  Filed Vitals:   12/07/14 1114  BP: 132/80  Resp: 14  Height:  (1.6 m)  Weight: 139 lb 6.4 oz (63.231 kg)    Body mass index is 24.7 kg/(m^2).   General: The patient is well-developed and well-nourished and in no acute distress  Musculoskeletal:   She is tender over the right trochanteric bursa.  Neurologic Exam  Mental status: The patient is alert and oriented x 3 at the time of the examination. The patient has apparent normal recent and remote memory, with an apparently normal attention span and concentration ability.   Speech is normal.  Cranial nerves: Extraocular movements are full. Pupils are equal, round, and reactive to light and accomodation.  Visual fields are full.  Facial symmetry  is present. There is good facial sensation to soft touch bilaterally.Facial strength is normal.  Trapezius and sternocleidomastoid strength is normal. No  dysarthria is noted.  The tongue is midline, and the patient has symmetric elevation of the soft palate. No obvious hearing deficits are noted.  Motor:  Muscle bulk is normal.    Tone is increased in both legs. Strength is  5 / 5 in all 4 extremities.   Sensory: Sensory testing is intact to pinprick, soft touch, vibration sensation, and position sense on all 4 extremities.  Coordination: Cerebellar testing reveals good finger-nose-finger and mildly reduced heel-to-shin bilaterally.  Gait and station: Station is normal and gait is spastic and mildly wide. Tandem gait is wide. Romberg is negative.   Reflexes: Deep tendon reflexes are increased bilaterally in legs.  She has spread at the knees. Plantar responses are normal.    DIAGNOSTIC DATA (LABS, IMAGING, TESTING) - I reviewed patient records, labs, notes, testing and imaging myself where available.  Lab Results  Component Value Date   HGB 12.0 10/09/2010      Component Value Date/Time   PROT 6.7 09/29/2007 0837   ALBUMIN 4.1 09/29/2007 0837   AST 34 09/29/2007 0837   ALT 30 09/29/2007 0837   ALKPHOS 88 09/29/2007 0837   BILITOT 1.1 09/29/2007 0837   Lab Results  Component Value Date   CHOL 136 09/29/2007   HDL 41.5 09/29/2007   LDLCALC 69 09/29/2007   TRIG 126 09/29/2007   CHOLHDL 3.3 CALC 09/29/2007       ASSESSMENT AND PLAN  DS (disseminated sclerosis)  Abnormality of gait  Bladder neurogenesis  Clinical depression  Other fatigue  Numbness  OP (osteoporosis)  Trochanteric bursitis of right hip    1.   Right trochanteric bursa injection with 40 mg Depo-Medrol in 3 mL Marcaine using sterile technique. She tolerated the procedure well. 2.   continue Tysabri 3.  Refill  med's   4.  Remain active and continue to exercise.  5.  She will return to see me in 4 - 5 months, sooner if she has new or worsening neurologic symptoms.   Richard A. Epimenio Foot, MD, PhD 12/07/2014, 11:23 AM Certified in Neurology,  Clinical Neurophysiology, Sleep Medicine, Pain Medicine and Neuroimaging  [facility] [address] [place], [place] [address] [phone]

## 2015-01-26 ENCOUNTER — Other Ambulatory Visit: Payer: Self-pay

## 2015-01-26 MED ORDER — DESMOPRESSIN ACETATE 0.1 MG PO TABS: 0.1000 mg | ORAL_TABLET | Freq: Every day | ORAL | Status: DC

## 2015-01-26 MED ORDER — OXYBUTYNIN CHLORIDE ER 10 MG PO TB24: 20.0000 mg | ORAL_TABLET | Freq: Every day | ORAL | Status: DC

## 2015-01-31 ENCOUNTER — Telehealth: Payer: Self-pay | Admitting: Neurology

## 2015-01-31 NOTE — Telephone Encounter (Signed)
Kellie [name] with [employer] [phone]) called has not received reauthorization form. Enrollment date ends 02/01/15. Please complete form (it was faxed 01/27/15)and fax back.

## 2015-01-31 NOTE — Telephone Encounter (Signed)
I have spoken with Rene Kocher at [facility] and advised I have not received reauth questionnaire for Kellie [name].  She will refax/fim

## 2015-02-06 ENCOUNTER — Other Ambulatory Visit: Payer: Self-pay | Admitting: *Deleted

## 2015-02-06 ENCOUNTER — Other Ambulatory Visit (INDEPENDENT_AMBULATORY_CARE_PROVIDER_SITE_OTHER): Payer: Self-pay

## 2015-02-06 DIAGNOSIS — G35 Multiple sclerosis: Secondary | ICD-10-CM

## 2015-02-06 DIAGNOSIS — Z79899 Other long term (current) drug therapy: Secondary | ICD-10-CM

## 2015-02-06 DIAGNOSIS — Z0289 Encounter for other administrative examinations: Secondary | ICD-10-CM

## 2015-02-06 MED ORDER — METHYLPHENIDATE HCL 10 MG PO TABS: 10.0000 mg | ORAL_TABLET | Freq: Four times a day (QID) | ORAL | Status: DC

## 2015-02-06 NOTE — Telephone Encounter (Signed)
Touch paperwork was received and faxed back to [employer]/fim

## 2015-02-06 NOTE — Telephone Encounter (Signed)
Ritalin rx. provided at infusion appt/fim

## 2015-02-07 LAB — CBC WITH DIFFERENTIAL/PLATELET
BASOS ABS: 0.1 10*3/uL (ref 0.0–0.2)
Basos: 1 %
EOS (ABSOLUTE): 0.6 10*3/uL — ABNORMAL HIGH (ref 0.0–0.4)
EOS: 6 %
HEMATOCRIT: 39.5 % (ref 34.0–46.6)
Hemoglobin: 13.3 g/dL (ref 11.1–15.9)
Immature Grans (Abs): 0.1 10*3/uL (ref 0.0–0.1)
Immature Granulocytes: 1 %
LYMPHS ABS: 3.8 10*3/uL — AB (ref 0.7–3.1)
Lymphs: 37 %
MCH: 30.6 pg (ref 26.6–33.0)
MCHC: 33.7 g/dL (ref 31.5–35.7)
MCV: 91 fL (ref 79–97)
MONOS ABS: 0.9 10*3/uL (ref 0.1–0.9)
Monocytes: 9 %
Neutrophils Absolute: 4.8 10*3/uL (ref 1.4–7.0)
Neutrophils: 46 %
Platelets: 199 10*3/uL (ref 150–379)
RBC: 4.35 x10E6/uL (ref 3.77–5.28)
RDW: 14.7 % (ref 12.3–15.4)
WBC: 10.1 10*3/uL (ref 3.4–10.8)

## 2015-02-08 ENCOUNTER — Encounter: Payer: Self-pay | Admitting: *Deleted

## 2015-02-10 ENCOUNTER — Encounter: Payer: Self-pay | Admitting: *Deleted

## 2015-02-16 ENCOUNTER — Other Ambulatory Visit: Payer: Self-pay | Admitting: Neurology

## 2015-02-27 ENCOUNTER — Other Ambulatory Visit: Payer: Self-pay | Admitting: Neurology

## 2015-03-24 ENCOUNTER — Other Ambulatory Visit: Payer: Self-pay

## 2015-03-24 MED ORDER — NITROFURANTOIN MACROCRYSTAL 100 MG PO CAPS: 100.0000 mg | ORAL_CAPSULE | Freq: Every day | ORAL | Status: AC

## 2015-04-05 ENCOUNTER — Other Ambulatory Visit: Payer: Self-pay | Admitting: Neurology

## 2015-04-05 MED ORDER — DIAZEPAM 5 MG PO TABS: 5.0000 mg | ORAL_TABLET | Freq: Two times a day (BID) | ORAL | Status: DC | PRN

## 2015-04-05 MED ORDER — METHYLPHENIDATE HCL 10 MG PO TABS: 10.0000 mg | ORAL_TABLET | Freq: Four times a day (QID) | ORAL | Status: DC

## 2015-04-20 ENCOUNTER — Other Ambulatory Visit: Payer: Self-pay | Admitting: *Deleted

## 2015-04-20 MED ORDER — VENLAFAXINE HCL ER 150 MG PO CP24: 300.0000 mg | ORAL_CAPSULE | Freq: Every day | ORAL | Status: DC

## 2015-04-20 NOTE — Telephone Encounter (Signed)
Effexor r/f escribed to [facility] [place] per faxed request/fim

## 2015-06-07 ENCOUNTER — Ambulatory Visit (INDEPENDENT_AMBULATORY_CARE_PROVIDER_SITE_OTHER): Payer: Medicare Other | Admitting: Neurology

## 2015-06-07 ENCOUNTER — Encounter: Payer: Self-pay | Admitting: Neurology

## 2015-06-07 VITALS — BP 116/64 | HR 72 | Resp 14 | Ht 63.0 in | Wt 142.5 lb

## 2015-06-07 DIAGNOSIS — G35 Multiple sclerosis: Secondary | ICD-10-CM | POA: Diagnosis not present

## 2015-06-07 DIAGNOSIS — R5383 Other fatigue: Secondary | ICD-10-CM

## 2015-06-07 DIAGNOSIS — F32A Depression, unspecified: Secondary | ICD-10-CM

## 2015-06-07 DIAGNOSIS — R200 Anesthesia of skin: Secondary | ICD-10-CM | POA: Diagnosis not present

## 2015-06-07 DIAGNOSIS — F329 Major depressive disorder, single episode, unspecified: Secondary | ICD-10-CM

## 2015-06-07 DIAGNOSIS — N319 Neuromuscular dysfunction of bladder, unspecified: Secondary | ICD-10-CM | POA: Diagnosis not present

## 2015-06-07 DIAGNOSIS — R269 Unspecified abnormalities of gait and mobility: Secondary | ICD-10-CM | POA: Diagnosis not present

## 2015-06-07 MED ORDER — GABAPENTIN 600 MG PO TABS: ORAL_TABLET | ORAL | Status: DC

## 2015-06-07 NOTE — Progress Notes (Signed)
[facility]  PATIENT: Kellie [name] DOB: 1960-05-23  REFERRING CLINICIAN: Vernona [name] HISTORY FROM: Patient  REASON FOR VISIT: Multiple Sclerosis   HISTORICAL  CHIEF COMPLAINT:  Chief Complaint  Patient presents with  . Multiple Sclerosis    Sts. she continues to tolerate Tysabri well.  JCV ab last checked 02-06-15 and was negative at 0.16.  Sts. she often falls but believes "that's just clumsiness."  She denies worsening gait or balance.  Still participates in jazzercise/fim    HISTORY OF PRESENT ILLNESS:  Kellie [name] is a 55 year old woman with MS diagnoses in 1997.   She is on Tysabri therapy and is JCV Ab (02/06/2015) negative at 0.16.   She denies any new exacerbations though gait has worsened some in last couple years.   She showed me recent lab-work showing good CBC and CMP values.  Pain:    She still has pain at the right hip. Laying on the right side increases the pain. This is a hip that she broke several years ago after a fall.   She also has left groin pain.    The trochanteric bursa injection last visit only helped a couple days.   She has some left groin numbness that seems new and associated with her pain  Gait/strength/sensation:  She has difficulty with her gait mostly due to decreased balance and has some falls.   She continues to exercise regularly with Jazzercise.      Her the right leg seems weaker than the left.   Left leg has reduced ROM form fracture in past.     She reports mild chronic tingling in the arms and legs. The right side is affected more than the left. The tingling is usually not very painful and helped by gabapentin..   She tolerates that well.   She has mild weakness and spasticity in her legs.   Gait issues are worse when she is tired or when hot.      Bladder/bowel:   She has had a lot of difficulties with urinary urgency and frequency.  She does intermittent catheterization up to 12 times a day. She is currently on oxybutynin  combined with Myrbetriq which have helped the bladder spasms. She sees Dr. Jacquelyne [name].   She denies any recent urinary tract infections.   She takes macrodantin bid.     She has severe constipation much of the time but sometimes gets strong urge and very rare incontinence.  Sometimes she needs to manually evacuate her bowel further.    With the desmopressin she doesn't have to wake up to catheterize herself or because of incontinence.  Fatigue:   She has fatigue but she remains active. She does Jazzercise 5 days a week over 8 different classes. She feels better when she does her exercises.  Mood:   She has had depression in the past but is generally feeling good currently.   She has mild anxiety that does not bother her too much.    Effexor helps and is well tolerated  Cognition:   She has mild cognitive issues and has some verbal fluency issues.  She has mild short term memory issues and feels focus/atention is decreased.  Ritalin helps (just takes in am)  Sleep:   She sleeps well as Kellie [name] as she takes the Valium with desmopressin every night.  She is now able to fall asleep easily and stays asleep most of the night.      MS History:  She presented with left  sided numbness in September 1997.   She underwent an MRI and a lumbar puncture and was told MS was unlikely.   She had right sided numbness in January 1998 and had brain and spine MRI.    There were changes suggestive of MS and she was diagnosed in January 1998.   She was started on Betaseron.   She was on Betaseron x 10 years and then switched to Avonex for a year due to tolerability issues.   She had clinical exacerbations and MRI changes and then went on Tysabri in 2007 or 2008 and has had 92 infusions, her last infusion being 03/02/15.    She is transferring care from [facility] to [facility].  She tolerates Tysabri well.   She is JCV Ab negative.     An MRI of the spinal cord dated 02/02/2013 shows demyelinating plaque to the left adjacent to  C3, anteriorly adjacent to C4, to the right adjacent to C5-C6, posteriorly to the right adjacent to C6-C7. When that study was compared to an MRI dated 12/29/2006, there was no definite interval change. MRI of the brain the same day was abnormal showing signal changes compatible with the diagnosis of multiple sclerosis. Compared to the prior study dated 02/23/2012, there was no definite interval change.      MRI brain and cervical spine (09/2014) showing 4 cervical spine foci and typical demyelination plaques in brain (brain unchanged compared to 2012).Marland Kitchen     REVIEW OF SYSTEMS:  Constitutional: No fevers, chills, sweats, or change in appetite.   Notes fatigue and some insomnia Eyes: No visual changes, double vision, eye pain Ear, nose and throat: No hearing loss, ear pain, nasal congestion, sore throat Cardiovascular: No chest pain, palpitations Respiratory:  No shortness of breath at rest or with exertion.   No wheezes GastrointestinaI: No nausea, vomiting, diarrhea, abdominal pain.   She has dysphagia Genitourinary: as above. Musculoskeletal:  as above iiIntegumentary: No rash, pruritus, skin lesions Neurological: as above Psychiatric: Mild depression at this time.  Mild anxiety Endocrine: No palpitations, diaphoresis, change in appetite, change in weigh or increased thirst Hematologic/Lymphatic:  No anemia, purpura, petechiae. Allergic/Immunologic: No itchy/runny eyes, nasal congestion, recent allergic reactions, rashes  ALLERGIES: Allergies  Allergen Reactions  . Erythromycin Nausea And Vomiting  . Oxycodone-Acetaminophen     Other reaction(s): Hallucinations  . Clindamycin/Lincomycin   . Sulfa Antibiotics     HOME MEDICATIONS:  Current outpatient prescriptions:  .  alendronate (FOSAMAX) 70 MG tablet, Take 1 tablet (70 mg total) by mouth once a week., Disp: 12 tablet, Rfl: 3 .  ALPRAZolam (XANAX) 0.25 MG tablet, Take 1 tablet (0.25 mg total) by mouth at bedtime as needed for  anxiety., Disp: 30 tablet, Rfl: 0 .  Calcium Carbonate-Vitamin D 600-125 MG-UNIT TABS, Take by mouth., Disp: , Rfl:  .  cetirizine (ZYRTEC) 1 MG/ML syrup, Take 5 mg by mouth., Disp: , Rfl:  .  desmopressin (DDAVP) 0.1 MG tablet, Take 1 tablet (0.1 mg total) by mouth daily., Disp: 90 tablet, Rfl: 1 .  diazepam (VALIUM) 5 MG tablet, Take 1 tablet (5 mg total) by mouth 2 (two) times daily as needed., Disp: 180 tablet, Rfl: 1 .  famciclovir (FAMVIR) 250 MG tablet, TAKE 1 TABLET BY MOUTH TWICE A DAY, Disp: , Rfl:  .  FLUARIX QUADRIVALENT 0.5 ML injection, , Disp: , Rfl: 0 .  gabapentin (NEURONTIN) 600 MG tablet, Take 2 tablets 4 times daily, Disp: 240 tablet, Rfl: 11 .  methylphenidate (RITALIN)  10 MG tablet, Take 1 tablet (10 mg total) by mouth 4 (four) times daily., Disp: 360 tablet, Rfl: 0 .  MYRBETRIQ 50 MG TB24 tablet, TAKE 1 TABLET ONCE DAILY., Disp: 30 tablet, Rfl: 6 .  natalizumab 300 mg in sodium chloride 0.9 % 100 mL, Inject into the vein., Disp: , Rfl:  .  nitrofurantoin (MACRODANTIN) 100 MG capsule, Take 1 capsule (100 mg total) by mouth at bedtime., Disp: 90 capsule, Rfl: 0 .  omeprazole (PRILOSEC) 20 MG capsule, 2 (two) times daily., Disp: , Rfl:  .  oxybutynin (DITROPAN-XL) 10 MG 24 hr tablet, Take 2 tablets (20 mg total) by mouth daily. As directed, Disp: 180 tablet, Rfl: 1 .  ranitidine (ZANTAC) 150 MG tablet, Take 150 mg by mouth., Disp: , Rfl:  .  simvastatin (ZOCOR) 40 MG tablet, TAKE 1 TABLET AT BEDTIME FOR CHOLESTEROL, Disp: , Rfl:  .  venlafaxine XR (EFFEXOR-XR) 150 MG 24 hr capsule, Take 2 capsules (300 mg total) by mouth daily., Disp: 90 capsule, Rfl: 5 .  vitamin B-12 (CYANOCOBALAMIN) 100 MCG tablet, Take 100 mcg by mouth daily., Disp: , Rfl:    PAST MEDICAL HISTORY: Past Medical History  Diagnosis Date  . Chronic kidney disease   . Multiple sclerosis (HCC)   . Neuropathy (HCC)   . Vision abnormalities   . Complication of anesthesia     pt states she got thrush after  anesthesia    PAST SURGICAL HISTORY: Past Surgical History  Procedure Laterality Date  . Hysterctomy    . Hip fracture surgery    . Carpal tunnel release Right     2010  . Carpal tunnel release Left   . Endometrial ablation      2007  . Cesarean section    . Tubal ligation    . Colonoscopy  07/26/14    FAMILY HISTORY: Family History  Problem Relation Age of Onset  . Lung cancer Mother   . Dementia Father     SOCIAL HISTORY:  Social History   Social History  . Marital Status: Legally Separated    Spouse Name: N/A  . Number of Children: N/A  . Years of Education: N/A   Occupational History  . Not on file.   Social History Main Topics  . Smoking status: Former Smoker    Quit date: 03/29/1981  . Smokeless tobacco: Not on file  . Alcohol Use: 0.0 oz/week    0 Standard drinks or equivalent per week     Comment: Occasional  . Drug Use: No  . Sexual Activity: Not on file   Other Topics Concern  . Not on file   Social History Narrative     PHYSICAL EXAM  Filed Vitals:   06/07/15 1111  BP: 116/64  Pulse: 72  Resp: 14  Height: 5\' 3"  (1.6 m)  Weight: 142 lb 8 oz (64.638 kg)    Body mass index is 25.25 kg/(m^2).   General: The patient is well-developed and well-nourished and in no acute distress  Musculoskeletal:   She is mildly tender over the right trochanteric bursa and right buttock/piriformis  Neurologic Exam  Mental status: The patient is alert and oriented x 3 at the time of the examination. The patient has apparent normal recent and remote memory, with an apparently normal attention span and concentration ability.   Speech is normal.  Cranial nerves: Extraocular movements are full. Pupils are equal, round, and reactive to light and accomodation.  Visual fields are full.  Facial  symmetry is present. There is good facial sensation to soft touch bilaterally.Facial strength is normal.  Trapezius and sternocleidomastoid strength is normal. No  dysarthria is noted.  The tongue is midline, and the patient has symmetric elevation of the soft palate. No obvious hearing deficits are noted.  Motor:  Muscle bulk is normal.    Tone is increased in both legs. Strength is  5 / 5 in all 4 extremities.   Sensory: Sensory testing is intact to pinprick, soft touch, vibration sensation, and position sense on all 4 extremities.  Coordination: Cerebellar testing reveals good finger-nose-finger and mildly reduced heel-to-shin bilaterally.  Gait and station: Station is normal and gait is spastic and mildly wide. Tandem gait is wide. Romberg is negative.   Reflexes: Deep tendon reflexes are increased bilaterally in legs.  She has spread at the knees. Plantar responses are normal.    DIAGNOSTIC DATA (LABS, IMAGING, TESTING) - I reviewed patient records, labs, notes, testing and imaging myself where available.  Lab Results  Component Value Date   WBC 10.1 02/06/2015   HGB 12.0 10/09/2010   HCT 39.5 02/06/2015   MCV 91 02/06/2015   PLT 199 02/06/2015      Component Value Date/Time   PROT 6.7 09/29/2007 0837   ALBUMIN 4.1 09/29/2007 0837   AST 34 09/29/2007 0837   ALT 30 09/29/2007 0837   ALKPHOS 88 09/29/2007 0837   BILITOT 1.1 09/29/2007 0837   Lab Results  Component Value Date   CHOL 136 09/29/2007   HDL 41.5 09/29/2007   LDLCALC 69 09/29/2007   TRIG 126 09/29/2007   CHOLHDL 3.3 CALC 09/29/2007       ASSESSMENT AND PLAN  Multiple sclerosis (HCC)  Abnormality of gait  Bladder neurogenesis  Clinical depression  Numbness  Other fatigue    1.  continue Tysabri 3.  Refill  med's   4.  Remain active and continue to exercise.  5.  She will return to see me in 4 - 5 months, sooner if she has new or worsening neurologic symptoms.    Richard A. Epimenio Foot, MD, PhD 06/07/2015, 11:17 AM Certified in Neurology, Clinical Neurophysiology, Sleep Medicine, Pain Medicine and Neuroimaging  [facility] [address], [address] [place], [place] [address] [phone]

## 2015-07-19 ENCOUNTER — Telehealth: Payer: Self-pay | Admitting: *Deleted

## 2015-07-19 MED ORDER — VENLAFAXINE HCL ER 150 MG PO CP24: 300.0000 mg | ORAL_CAPSULE | Freq: Every day | ORAL | Status: DC

## 2015-07-19 NOTE — Telephone Encounter (Signed)
Venlafaxine 90 day rx. escribed to [facility] per faxed request/fim

## 2015-08-16 ENCOUNTER — Telehealth: Payer: Self-pay | Admitting: *Deleted

## 2015-08-16 MED ORDER — METHYLPHENIDATE HCL 10 MG PO TABS: 10.0000 mg | ORAL_TABLET | Freq: Four times a day (QID) | ORAL | Status: DC

## 2015-08-16 MED ORDER — ALPRAZOLAM 0.25 MG PO TABS: 0.2500 mg | ORAL_TABLET | Freq: Every evening | ORAL | Status: DC | PRN

## 2015-08-16 NOTE — Telephone Encounter (Signed)
Alprazolam and Ritalin rx's up front [facility]/fim

## 2015-08-23 ENCOUNTER — Other Ambulatory Visit: Payer: Self-pay | Admitting: Neurology

## 2015-09-11 ENCOUNTER — Telehealth: Payer: Self-pay | Admitting: *Deleted

## 2015-09-11 MED ORDER — METHYLPHENIDATE HCL 10 MG PO TABS: 10.0000 mg | ORAL_TABLET | Freq: Four times a day (QID) | ORAL | Status: DC

## 2015-09-11 MED ORDER — ALPRAZOLAM 0.25 MG PO TABS: 0.2500 mg | ORAL_TABLET | Freq: Every evening | ORAL | Status: DC | PRN

## 2015-09-11 NOTE — Telephone Encounter (Signed)
Allysha will pick up alprazolam and Ritalin rx's at infusion appt. on 09-14-15, but RAS will be ooo that day, so rx's prepared today and up front [facility]

## 2015-10-12 ENCOUNTER — Other Ambulatory Visit: Payer: Self-pay | Admitting: Neurology

## 2015-10-12 MED ORDER — DIAZEPAM 5 MG PO TABS: 5.0000 mg | ORAL_TABLET | Freq: Two times a day (BID) | ORAL | 1 refills | Status: DC | PRN

## 2015-10-12 MED ORDER — METHYLPHENIDATE HCL 10 MG PO TABS: 10.0000 mg | ORAL_TABLET | Freq: Four times a day (QID) | ORAL | 0 refills | Status: DC

## 2015-11-07 ENCOUNTER — Telehealth: Payer: Self-pay | Admitting: Neurology

## 2015-11-07 NOTE — Telephone Encounter (Signed)
Patient called requesting that I go down to where Inetta Fermo is and tell her she needs to talk with her about getting an infusion today, doesn't want to be transferred to Inetta Fermo "it takes a while sometimes before Inetta Fermo gets to her voicemail's". I explained that if Inetta Fermo isn't picking up, it's because she's tied up with a patient at that moment. She said she knows when she's been here Inetta Fermo doesn't answer the phone when she's working with her or someone else. Placed patient on hold, hand delivered message to [place]. Advised patient that I delivered message to Inetta Fermo, in the future she should leave Inetta Fermo a message and Inetta Fermo will call her back when she's not with a patient.

## 2015-11-10 ENCOUNTER — Encounter: Payer: Self-pay | Admitting: Neurology

## 2015-11-10 ENCOUNTER — Ambulatory Visit (INDEPENDENT_AMBULATORY_CARE_PROVIDER_SITE_OTHER): Payer: Medicare Other | Admitting: Neurology

## 2015-11-10 ENCOUNTER — Telehealth: Payer: Self-pay | Admitting: Neurology

## 2015-11-10 VITALS — BP 134/86 | HR 72 | Resp 14 | Ht 63.0 in | Wt 146.0 lb

## 2015-11-10 DIAGNOSIS — N319 Neuromuscular dysfunction of bladder, unspecified: Secondary | ICD-10-CM | POA: Diagnosis not present

## 2015-11-10 DIAGNOSIS — F329 Major depressive disorder, single episode, unspecified: Secondary | ICD-10-CM | POA: Diagnosis not present

## 2015-11-10 DIAGNOSIS — R5383 Other fatigue: Secondary | ICD-10-CM | POA: Diagnosis not present

## 2015-11-10 DIAGNOSIS — G35 Multiple sclerosis: Secondary | ICD-10-CM | POA: Diagnosis not present

## 2015-11-10 DIAGNOSIS — M7061 Trochanteric bursitis, right hip: Secondary | ICD-10-CM | POA: Diagnosis not present

## 2015-11-10 DIAGNOSIS — R269 Unspecified abnormalities of gait and mobility: Secondary | ICD-10-CM | POA: Diagnosis not present

## 2015-11-10 DIAGNOSIS — R200 Anesthesia of skin: Secondary | ICD-10-CM | POA: Diagnosis not present

## 2015-11-10 DIAGNOSIS — F32A Depression, unspecified: Secondary | ICD-10-CM

## 2015-11-10 MED ORDER — LAMOTRIGINE 100 MG PO TABS: ORAL_TABLET | ORAL | 11 refills | Status: DC

## 2015-11-10 NOTE — Progress Notes (Signed)
[facility]  PATIENT: Kellie [name] DOB: May 15, 1960  REFERRING CLINICIAN: Vernona Rieger HISTORY FROM: Patient  REASON FOR VISIT: Multiple Sclerosis   HISTORICAL  CHIEF COMPLAINT:  Chief Complaint  Patient presents with  . Multiple Sclerosis    Sts. she continues to tolerate Tysabri well.  JCV ab last checked 02-06-15 and was Negative at 0.16.  Today she c/o worsening pain bilat legs, right worse than left./fim    HISTORY OF PRESENT ILLNESS:  Kellie [name] is a 55 year old woman with MS diagnoses in 1997.   She is on Tysabri therapy and is JCV Ab (02/06/2015) negative at 0.16.   She denies any new exacerbations though gait has worsened some in last couple years.   She showed me recent lab-work showing good CBC and CMP values.  Pain:    The right leg is very painful with throbbing.   Left leg is only mildly painful.   Some pain is in the hip and Dr. Dewaine Oats did a cortisone shot.   A trochanteric bursa injection last year we did  only helped a couple days.   Much of the pain is in the buttock region.    Gait/strength/sensation:  She has difficulty with her gait mostly due to decreased balance and has some falls.   She exercise regularly with [employer].      Her the right leg seems weaker than the left.   Left leg has reduced ROM form fracture in past.     She reports mild chronic tingling in the arms.  The tingling is usually not very painful and helped by gabapentin..   She tolerates that well.   She has mild weakness and spasticity in her legs.   Gait issues are worse when she is tired or when hot.      Bladder/bowel:   She has had a lot of difficulties with urinary urgency and frequency.  She does intermittent catheterization every few hous. She is currently on oxybutynin combined with Myrbetriq bid with benefit.    She sees Dr. Jacquelyne Balint.   She denies any recent urinary tract infections.   She takes macrodantin bid.     She has severe constipation much of the time but  sometimes gets strong urge and very rare incontinence.  Sometimes she needs to manually evacuate her bowel further.    With the desmopressin she doesn't have to wake up to catheterize herself or because of incontinence.  Fatigue:   She has fatigue, about the same as last visit, but she remains active. She does Jazzercise 5 days a week over 8 different classes. She feels better when she does her exercises.  Mood:   She has had depression in the past but is generally feeling good currently.  Her son was recently injured doing a Building control surveyor.   She has financial worries.   She has some anxiety also.    Effexor helps some and is well tolerated  Cognition:   She has mild cognitive issues and has some verbal fluency issues.  She has mild short term memory issues and feels focus/atention is decreased.  Ritalin helps attention and focus some.  Sleep:   She sleeps well most nightsas she takes the Valium with desmopressin every night.  She is now able to fall asleep easily and stays asleep most of the night.      MS History:  She presented with left sided numbness in September 1997.   She underwent an MRI and a lumbar puncture and  was told MS was unlikely.   She had right sided numbness in January 1998 and had brain and spine MRI.    There were changes suggestive of MS and she was diagnosed in January 1998.   She was started on Betaseron.   She was on Betaseron x 10 years and then switched to Avonex for a year due to tolerability issues.   She had clinical exacerbations and MRI changes and then went on Tysabri in 2007 or 2008 and has had 92 infusions, her last infusion being 03/02/15.    She is [facility].  She tolerates Tysabri well.   She is JCV Ab negative.     An MRI of the spinal cord dated 02/02/2013 shows demyelinating plaque to the left adjacent to C3, anteriorly adjacent to C4, to the right adjacent to C5-C6, posteriorly to the right adjacent to C6-C7. When that  study was compared to an MRI dated 12/29/2006, there was no definite interval change. MRI of the brain the same day was abnormal showing signal changes compatible with the diagnosis of multiple sclerosis. Compared to the prior study dated 02/23/2012, there was no definite interval change.      MRI brain and cervical spine (09/2014) showing 4 cervical spine foci and typical demyelination plaques in brain (brain unchanged compared to 2012).Marland Kitchen.     REVIEW OF SYSTEMS:  Constitutional: No fevers, chills, sweats, or change in appetite.   Notes fatigue and some insomnia Eyes: No visual changes, double vision, eye pain Ear, nose and throat: No hearing loss, ear pain, nasal congestion, sore throat Cardiovascular: No chest pain, palpitations Respiratory:  No shortness of breath at rest or with exertion.   No wheezes GastrointestinaI: No nausea, vomiting, diarrhea, abdominal pain.   She has dysphagia Genitourinary: as above. Musculoskeletal:  as above iiIntegumentary: No rash, pruritus, skin lesions Neurological: as above Psychiatric: Mild depression at this time.  Mild anxiety Endocrine: No palpitations, diaphoresis, change in appetite, change in weigh or increased thirst Hematologic/Lymphatic:  No anemia, purpura, petechiae. Allergic/Immunologic: No itchy/runny eyes, nasal congestion, recent allergic reactions, rashes  ALLERGIES: Allergies  Allergen Reactions  . Erythromycin Nausea And Vomiting  . Oxycodone-Acetaminophen     Other reaction(s): Hallucinations  . Clindamycin/Lincomycin   . Sulfa Antibiotics     HOME MEDICATIONS:  Current Outpatient Prescriptions:  .  ALPRAZolam (XANAX) 0.25 MG tablet, Take 1 tablet (0.25 mg total) by mouth at bedtime as needed for anxiety., Disp: 30 tablet, Rfl: 0 .  Calcium Carbonate-Vitamin D 600-125 MG-UNIT TABS, Take by mouth., Disp: , Rfl:  .  cetirizine (ZYRTEC) 1 MG/ML syrup, Take 5 mg by mouth., Disp: , Rfl:  .  desmopressin (DDAVP) 0.1 MG tablet, TAKE  1 TABLET EVERY DAY, Disp: 90 tablet, Rfl: 3 .  diazepam (VALIUM) 5 MG tablet, Take 1 tablet (5 mg total) by mouth 2 (two) times daily as needed., Disp: 180 tablet, Rfl: 1 .  famciclovir (FAMVIR) 250 MG tablet, TAKE 1 TABLET BY MOUTH TWICE A DAY, Disp: , Rfl:  .  Fish Oil-Cholecalciferol (FISH OIL + D3) 1000-1000 MG-UNIT CAPS, Take by mouth., Disp: , Rfl:  .  FLUARIX QUADRIVALENT 0.5 ML injection, , Disp: , Rfl: 0 .  gabapentin (NEURONTIN) 600 MG tablet, Take 2 tablets 4 times daily, Disp: 720 tablet, Rfl: 3 .  methylphenidate (RITALIN) 10 MG tablet, Take 1 tablet (10 mg total) by mouth 4 (four) times daily., Disp: 360 tablet, Rfl: 0 .  MYRBETRIQ 50 MG  TB24 tablet, TAKE 1 TABLET ONCE DAILY., Disp: 30 tablet, Rfl: 6 .  natalizumab 300 mg in sodium chloride 0.9 % 100 mL, Inject into the vein., Disp: , Rfl:  .  nitrofurantoin (MACRODANTIN) 100 MG capsule, Take 1 capsule (100 mg total) by mouth at bedtime., Disp: 90 capsule, Rfl: 0 .  omeprazole (PRILOSEC) 20 MG capsule, 2 (two) times daily., Disp: , Rfl:  .  oxybutynin (DITROPAN-XL) 10 MG 24 hr tablet, Take 2 tablets (20 mg total) by mouth daily. As directed, Disp: 180 tablet, Rfl: 1 .  simvastatin (ZOCOR) 40 MG tablet, TAKE 1 TABLET AT BEDTIME FOR CHOLESTEROL, Disp: , Rfl:  .  venlafaxine XR (EFFEXOR-XR) 150 MG 24 hr capsule, Take 2 capsules (300 mg total) by mouth daily., Disp: 180 capsule, Rfl: 3 .  vitamin B-12 (CYANOCOBALAMIN) 100 MCG tablet, Take 100 mcg by mouth daily., Disp: , Rfl:  .  alendronate (FOSAMAX) 70 MG tablet, Take 1 tablet (70 mg total) by mouth once a week. (Patient not taking: Reported on 11/10/2015), Disp: 12 tablet, Rfl: 3 .  ranitidine (ZANTAC) 150 MG tablet, Take 150 mg by mouth., Disp: , Rfl:    PAST MEDICAL HISTORY: Past Medical History:  Diagnosis Date  . Chronic kidney disease   . Complication of anesthesia    pt states she got thrush after anesthesia  . Multiple sclerosis (HCC)   . Neuropathy (HCC)   . Vision  abnormalities     PAST SURGICAL HISTORY: Past Surgical History:  Procedure Laterality Date  . CARPAL TUNNEL RELEASE Right    2010  . CARPAL TUNNEL RELEASE Left   . CESAREAN SECTION    . COLONOSCOPY  07/26/14  . ENDOMETRIAL ABLATION     2007  . HIP FRACTURE SURGERY    . hysterctomy    . TUBAL LIGATION      FAMILY HISTORY: Family History  Problem Relation Age of Onset  . Lung cancer Mother   . Dementia Father     SOCIAL HISTORY:  Social History   Social History  . Marital status: Legally Separated    Spouse name: N/A  . Number of children: N/A  . Years of education: N/A   Occupational History  . Not on file.   Social History Main Topics  . Smoking status: Former Smoker    Quit date: 03/29/1981  . Smokeless tobacco: Not on file  . Alcohol use 0.0 oz/week     Comment: Occasional  . Drug use: No  . Sexual activity: Not on file   Other Topics Concern  . Not on file   Social History Narrative  . No narrative on file     PHYSICAL EXAM  Vitals:   11/10/15 0946  BP: 134/86  Pulse: 72  Resp: 14  Weight: 146 lb (66.2 kg)  Height: 5\' 3"  (1.6 m)    Body mass index is 25.86 kg/m.   General: The patient is well-developed and well-nourished and in no acute distress  Musculoskeletal:   She is mildly tender over the right trochanteric bursa and right buttock/piriformis  Neurologic Exam  Mental status: The patient is alert and oriented x 3 at the time of the examination. The patient has apparent normal recent and remote memory, with an apparently normal attention span and concentration ability.   Speech is normal.  Cranial nerves: Extraocular movements are full. Pupils are equal, round, and reactive to light and accomodation.  Visual fields are full.  Facial symmetry is present. There is good  facial sensation to soft touch bilaterally.Facial strength is normal.  Trapezius and sternocleidomastoid strength is normal. No dysarthria is noted.  The tongue is  midline, and the patient has symmetric elevation of the soft palate. No obvious hearing deficits are noted.  Motor:  Muscle bulk is normal.    Tone is increased in both legs. Strength is  5 / 5 in all 4 extremities.   Sensory: Sensory testing is intact to pinprick, soft touch, vibration sensation, and position sense on all 4 extremities.  Coordination: Cerebellar testing reveals good finger-nose-finger and mildly reduced heel-to-shin bilaterally.  Gait and station: Station is normal and gait is spastic and mildly wide. Tandem gait is wide. Romberg is negative.   Reflexes: Deep tendon reflexes are increased bilaterally in legs.  She has spread at the knees. Plantar responses are normal.    DIAGNOSTIC DATA (LABS, IMAGING, TESTING) - I reviewed patient records, labs, notes, testing and imaging myself where available.  Lab Results  Component Value Date   WBC 10.1 02/06/2015   HGB 12.0 10/09/2010   HCT 39.5 02/06/2015   MCV 91 02/06/2015   PLT 199 02/06/2015      Component Value Date/Time   PROT 6.7 09/29/2007 0837   ALBUMIN 4.1 09/29/2007 0837   AST 34 09/29/2007 0837   ALT 30 09/29/2007 0837   ALKPHOS 88 09/29/2007 0837   BILITOT 1.1 09/29/2007 0837   Lab Results  Component Value Date   CHOL 136 09/29/2007   HDL 41.5 09/29/2007   LDLCALC 69 09/29/2007   TRIG 126 09/29/2007   CHOLHDL 3.3 CALC 09/29/2007       ASSESSMENT AND PLAN  Multiple sclerosis (HCC) - Plan: Stratify JCV Antibody Test ([facility]), CBC with Differential/Platelet  Abnormality of gait  Numbness  Other fatigue  Trochanteric bursitis of right hip  Bladder neurogenesis  Clinical depression     1.  continue Tysabri.    check JCV Ab titer and CBC today.   If she has converted to JCV antibody positive, consider a change to ocrelizumab. 2.   Continue gabapentin for the dysesthetic pain. Add lamotrigine and titrate up to 100 mg by mouth twice a day. 3.  Refill  med's   4.  Remain active and  continue to exercise.  5.  She will return to see me in 4 - 5 months, sooner if she has new or worsening neurologic symptoms.   45 minute face-to-face interaction with greater than one half of the time counseling and coordinating care about her multiple sclerosis and related symptoms.  Kellie [name] A. Epimenio Foot, MD, PhD 11/10/2015, 10:05 AM Certified in Neurology, Clinical Neurophysiology, Sleep Medicine, Pain Medicine and Neuroimaging  [facility] [address] [place], [place] [address] [phone]

## 2015-11-10 NOTE — Patient Instructions (Signed)
Lamotrigine 100 mg tablets were sent in to your pharmacy.  Take one half pill once a day for 5 days. Then take one half pill twice a day for 5 days Then take one half pill in the morning and one pill in the afternoon or night for 5 days. Then take 1 pill twice a day Halladay term.

## 2015-11-10 NOTE — Telephone Encounter (Signed)
Pt called to advise she is having rt leg is painful at the hip (fx) down to toes. She is requesting a call back from RN.

## 2015-11-10 NOTE — Telephone Encounter (Signed)
I have spoken with Kellie [name] this morning.  She reports more pain, restlessness bilat legs.  Last seen in March.  Appt. given with RAS for 0940/fim

## 2015-11-11 LAB — CBC WITH DIFFERENTIAL/PLATELET
BASOS ABS: 0.1 10*3/uL (ref 0.0–0.2)
Basos: 1 %
EOS (ABSOLUTE): 0.4 10*3/uL (ref 0.0–0.4)
EOS: 4 %
HEMATOCRIT: 40.8 % (ref 34.0–46.6)
HEMOGLOBIN: 13.9 g/dL (ref 11.1–15.9)
IMMATURE GRANULOCYTES: 1 %
Immature Grans (Abs): 0.1 10*3/uL (ref 0.0–0.1)
Lymphocytes Absolute: 3.9 10*3/uL — ABNORMAL HIGH (ref 0.7–3.1)
Lymphs: 34 %
MCH: 31 pg (ref 26.6–33.0)
MCHC: 34.1 g/dL (ref 31.5–35.7)
MCV: 91 fL (ref 79–97)
MONOCYTES: 9 %
Monocytes Absolute: 1 10*3/uL — ABNORMAL HIGH (ref 0.1–0.9)
NEUTROS PCT: 51 %
Neutrophils Absolute: 5.9 10*3/uL (ref 1.4–7.0)
Platelets: 204 10*3/uL (ref 150–379)
RBC: 4.48 x10E6/uL (ref 3.77–5.28)
RDW: 15.4 % (ref 12.3–15.4)
WBC: 11.4 10*3/uL — AB (ref 3.4–10.8)

## 2015-11-17 ENCOUNTER — Encounter: Payer: Self-pay | Admitting: *Deleted

## 2015-12-06 ENCOUNTER — Ambulatory Visit: Payer: Medicare Other | Admitting: Neurology

## 2015-12-18 ENCOUNTER — Other Ambulatory Visit: Payer: Self-pay | Admitting: Neurology

## 2015-12-18 NOTE — Telephone Encounter (Signed)
Pt called in requesting a new rx be sent in to pharmacy for a 90 day supply b/c it is cheaper and to also have the rx written for the full dose not half doses. Please call and advise [phone]

## 2015-12-19 MED ORDER — LAMOTRIGINE 100 MG PO TABS: ORAL_TABLET | ORAL | 3 refills | Status: DC

## 2015-12-19 NOTE — Telephone Encounter (Signed)
90 refill of Lamictal sent in for patient.

## 2016-02-08 ENCOUNTER — Encounter: Payer: Self-pay | Admitting: *Deleted

## 2016-02-23 ENCOUNTER — Other Ambulatory Visit: Payer: Self-pay | Admitting: Neurology

## 2016-02-23 MED ORDER — METHYLPHENIDATE HCL 10 MG PO TABS: 10.0000 mg | ORAL_TABLET | Freq: Four times a day (QID) | ORAL | 0 refills | Status: DC

## 2016-03-06 ENCOUNTER — Other Ambulatory Visit: Payer: Self-pay | Admitting: Neurology

## 2016-03-06 ENCOUNTER — Other Ambulatory Visit: Payer: Self-pay | Admitting: *Deleted

## 2016-03-06 MED ORDER — ALPRAZOLAM 0.25 MG PO TABS: 0.2500 mg | ORAL_TABLET | Freq: Every evening | ORAL | 0 refills | Status: DC | PRN

## 2016-03-06 NOTE — Telephone Encounter (Signed)
Pt request refill for ALPRAZolam (XANAX) 0.25 MG tablet sent to [facility]. Pt said she has 2 pills left.

## 2016-03-06 NOTE — Telephone Encounter (Signed)
Printed, signed, faxed and confirmed to requested pharmacy.

## 2016-03-20 ENCOUNTER — Telehealth: Payer: Self-pay | Admitting: *Deleted

## 2016-03-20 MED ORDER — OXYBUTYNIN CHLORIDE ER 10 MG PO TB24: 20.0000 mg | ORAL_TABLET | Freq: Every day | ORAL | 1 refills | Status: DC

## 2016-03-20 NOTE — Telephone Encounter (Signed)
Pt. requested r/f of Oxybutynin during infusion appt/fim

## 2016-05-15 ENCOUNTER — Ambulatory Visit: Payer: Medicare Other | Admitting: Neurology

## 2016-05-30 ENCOUNTER — Other Ambulatory Visit: Payer: Self-pay | Admitting: *Deleted

## 2016-07-29 ENCOUNTER — Encounter: Payer: Self-pay | Admitting: Neurology

## 2016-07-29 ENCOUNTER — Other Ambulatory Visit: Payer: Self-pay | Admitting: Neurology

## 2016-07-29 ENCOUNTER — Ambulatory Visit (INDEPENDENT_AMBULATORY_CARE_PROVIDER_SITE_OTHER): Payer: Medicare Other | Admitting: Neurology

## 2016-07-29 VITALS — BP 120/88 | Resp 16 | Ht 63.0 in | Wt 139.0 lb

## 2016-07-29 DIAGNOSIS — N319 Neuromuscular dysfunction of bladder, unspecified: Secondary | ICD-10-CM | POA: Diagnosis not present

## 2016-07-29 DIAGNOSIS — N3000 Acute cystitis without hematuria: Secondary | ICD-10-CM | POA: Diagnosis not present

## 2016-07-29 DIAGNOSIS — G35 Multiple sclerosis: Secondary | ICD-10-CM

## 2016-07-29 DIAGNOSIS — R200 Anesthesia of skin: Secondary | ICD-10-CM | POA: Diagnosis not present

## 2016-07-29 DIAGNOSIS — R269 Unspecified abnormalities of gait and mobility: Secondary | ICD-10-CM

## 2016-07-29 DIAGNOSIS — R5383 Other fatigue: Secondary | ICD-10-CM

## 2016-07-29 DIAGNOSIS — N390 Urinary tract infection, site not specified: Secondary | ICD-10-CM | POA: Insufficient documentation

## 2016-07-29 MED ORDER — METHYLPHENIDATE HCL 10 MG PO TABS: 10.0000 mg | ORAL_TABLET | Freq: Four times a day (QID) | ORAL | 0 refills | Status: DC

## 2016-07-29 MED ORDER — DIAZEPAM 5 MG PO TABS: 5.0000 mg | ORAL_TABLET | Freq: Two times a day (BID) | ORAL | 1 refills | Status: DC | PRN

## 2016-07-29 NOTE — Progress Notes (Signed)
[facility]  PATIENT: Kellie [name] DOB: 1960-08-12  REFERRING CLINICIAN: Vernona Rieger HISTORY FROM: Patient  REASON FOR VISIT: Multiple Sclerosis   HISTORICAL  CHIEF COMPLAINT:  Chief Complaint  Patient presents with  . Multiple Sclerosis    Sts. she continues to tolerate Tysabri well.  JCV ab last checked 11/26/15 and was negative at 0.16. Is seeing by Urology for persistent UTI/bladder problems. [name]    HISTORY OF PRESENT ILLNESS:  Kellie [name] is a 56 year old woman with MS diagnoses in 1997.     MS:  She has been on Tysabri since 2008. She tolerates it well and her MS has been mostly stable while she has been on it.   The JCV Ab (11/10/2015) negative at 0.12   She denies any new exacerbations though gait has worsened some in last couple years.   The last MRI of the brain and cervical spine was performed in 2016. They showed no new lesions.  There are at least 4 plaques in the cervical spine.   Gait/strength/sensation:  She fell in March hitting her head and had a mild concussion (no LOC).   Balance is poor and she falls occasionally.    She exercise regularly with Jazzercise.  She has mild weakness and spasticity in her legs.      Her the right leg seems weaker than the left.  No arm weakness but she sometimes drops items.    Left leg has reduced ROM form fracture in past.     She continues to have hand and leg tingling, helped by gabapentin.  The addition of lamotrigine has helped more.   She tolerates it well.    Gait issues are worse when she is tired or when hot.      Bladder/bowel:   She has had more UTI's.   She was recently switched from nitrofurantoin to Cipro.   She sees Dr. Perley Jain.   She continues to have problems with urinary urgency and frequency.  She does intermittent catheterization every few hous. She is currently on oxybutynin combined with Myrbetriq bid with benefit.    She has severe constipation but Linzess caused constipation.    With the  desmopressin she doesn't have to wake up to catheterize herself or because of incontinence.  Fatigue:   She has fatigue, about the same as last visit, but she remains active. She does Jazzercise 5 days a week over 8 different classes. She feels better when she does her exercises.  Mood:   She has had depression in the past but is generally feeling good currently.  Her son was recently injured doing a Building control surveyor.   She has financial worries.   She has some anxiety also.    Effexor helps some and is well tolerated  Cognition:   She has mild cognitive issues and has some verbal fluency issues.  She has mild short term memory issues and feels focus/atention is decreased.  Ritalin helps attention and focus some.   She feels these issues have been stable for the most part.  Pain:    The right leg is still painful intermittently.   She sees Dr. Dewaine Oats (Ortho)  A trochanteric bursa injection last year only helped a couple days.      Sleep:   She has insomnia that is better with Valium and desmopressin.   She falls asleep easily and stays asleep most of the night.      MS History:  She presented with left sided numbness  in September 1997.   She underwent an MRI and a lumbar puncture and was told MS was unlikely.   She had right sided numbness in January 1998 and had brain and spine MRI.    There were changes suggestive of MS and she was diagnosed in January 1998.   She was started on Betaseron.   She was on Betaseron x 10 years and then switched to Avonex for a year due to tolerability issues.   She had clinical exacerbations and MRI changes and then went on Tysabri in 2007 or 2008 and has had 92 infusions, her last infusion being 03/02/15.    She is transferring care from [facility] to [facility].  She tolerates Tysabri well.   She is JCV Ab negative.     An MRI of the spinal cord dated 02/02/2013 shows demyelinating plaque to the left adjacent to C3, anteriorly adjacent to C4, to the right adjacent to  C5-C6, posteriorly to the right adjacent to C6-C7. When that study was compared to an MRI dated 12/29/2006, there was no definite interval change. MRI of the brain the same day was abnormal showing signal changes compatible with the diagnosis of multiple sclerosis. Compared to the prior study dated 02/23/2012, there was no definite interval change.      MRI brain and cervical spine (09/2014) showing 4 cervical spine foci and typical demyelination plaques in brain (brain unchanged compared to 2012)..    She had MRI 05/2016 after a fall.       REVIEW OF SYSTEMS:  Constitutional: No fevers, chills, sweats, or change in appetite.   Notes fatigue and some insomnia Eyes: No visual changes, double vision, eye pain Ear, nose and throat: No hearing loss, ear pain, nasal congestion, sore throat Cardiovascular: No chest pain, palpitations Respiratory:  No shortness of breath at rest or with exertion.   No wheezes GastrointestinaI: No nausea, vomiting, diarrhea, abdominal pain.   She has dysphagia Genitourinary: as above. Musculoskeletal:  as above iiIntegumentary: No rash, pruritus, skin lesions Neurological: as above Psychiatric: Mild depression at this time.  Mild anxiety Endocrine: No palpitations, diaphoresis, change in appetite, change in weigh or increased thirst Hematologic/Lymphatic:  No anemia, purpura, petechiae. Allergic/Immunologic: No itchy/runny eyes, nasal congestion, recent allergic reactions, rashes  ALLERGIES: Allergies  Allergen Reactions  . Erythromycin Nausea And Vomiting  . Oxycodone-Acetaminophen     Other reaction(s): Hallucinations  . Clindamycin/Lincomycin   . Sulfa Antibiotics     HOME MEDICATIONS:  Current Outpatient Prescriptions:  .  ALPRAZolam (XANAX) 0.25 MG tablet, Take 1 tablet (0.25 mg total) by mouth at bedtime as needed for anxiety., Disp: 30 tablet, Rfl: 0 .  Calcium Carbonate-Vitamin D 600-125 MG-UNIT TABS, Take by mouth., Disp: , Rfl:  .  cetirizine  (ZYRTEC) 1 MG/ML syrup, Take 5 mg by mouth., Disp: , Rfl:  .  desmopressin (DDAVP) 0.1 MG tablet, TAKE 1 TABLET EVERY DAY, Disp: 90 tablet, Rfl: 3 .  diazepam (VALIUM) 5 MG tablet, Take 1 tablet (5 mg total) by mouth 2 (two) times daily as needed., Disp: 180 tablet, Rfl: 1 .  famciclovir (FAMVIR) 250 MG tablet, TAKE 1 TABLET BY MOUTH TWICE A DAY, Disp: , Rfl:  .  Fish Oil-Cholecalciferol (FISH OIL + D3) 1000-1000 MG-UNIT CAPS, Take by mouth., Disp: , Rfl:  .  FLUARIX QUADRIVALENT 0.5 ML injection, , Disp: , Rfl: 0 .  gabapentin (NEURONTIN) 600 MG tablet, Take 2 tablets 4 times daily, Disp: 720 tablet, Rfl: 3 .  lamoTRIgine (  LAMICTAL) 100 MG tablet, Take 100 mg by mouth 2 (two) times daily., Disp: , Rfl:  .  methylphenidate (RITALIN) 10 MG tablet, Take 1 tablet (10 mg total) by mouth 4 (four) times daily., Disp: 360 tablet, Rfl: 0 .  MYRBETRIQ 50 MG TB24 tablet, TAKE 1 TABLET ONCE DAILY., Disp: 30 tablet, Rfl: 6 .  natalizumab 300 mg in sodium chloride 0.9 % 100 mL, Inject into the vein., Disp: , Rfl:  .  nitrofurantoin (MACRODANTIN) 100 MG capsule, Take 1 capsule (100 mg total) by mouth at bedtime., Disp: 90 capsule, Rfl: 0 .  omeprazole (PRILOSEC) 20 MG capsule, 2 (two) times daily., Disp: , Rfl:  .  oxybutynin (DITROPAN-XL) 10 MG 24 hr tablet, Take 2 tablets (20 mg total) by mouth daily. As directed, Disp: 180 tablet, Rfl: 1 .  simvastatin (ZOCOR) 40 MG tablet, TAKE 1 TABLET AT BEDTIME FOR CHOLESTEROL, Disp: , Rfl:  .  venlafaxine XR (EFFEXOR-XR) 150 MG 24 hr capsule, TAKE 2 CAPSULES (300 MG TOTAL) BY MOUTH DAILY., Disp: 180 capsule, Rfl: 3 .  vitamin B-12 (CYANOCOBALAMIN) 1000 MCG tablet, Take 1,000 mcg by mouth daily., Disp: , Rfl:    PAST MEDICAL HISTORY: Past Medical History:  Diagnosis Date  . Chronic kidney disease   . Complication of anesthesia    pt states she got thrush after anesthesia  . Multiple sclerosis (HCC)   . Neuropathy   . Vision abnormalities     PAST SURGICAL  HISTORY: Past Surgical History:  Procedure Laterality Date  . CARPAL TUNNEL RELEASE Right    2010  . CARPAL TUNNEL RELEASE Left   . CESAREAN SECTION    . COLONOSCOPY  07/26/14  . ENDOMETRIAL ABLATION     2007  . HIP FRACTURE SURGERY    . hysterctomy    . TUBAL LIGATION      FAMILY HISTORY: Family History  Problem Relation Age of Onset  . Lung cancer Mother   . Dementia Father     SOCIAL HISTORY:  Social History   Social History  . Marital status: Legally Separated    Spouse name: N/A  . Number of children: N/A  . Years of education: N/A   Occupational History  . Not on file.   Social History Main Topics  . Smoking status: Former Smoker    Quit date: 03/29/1981  . Smokeless tobacco: Never Used  . Alcohol use 0.0 oz/week     Comment: Occasional  . Drug use: No  . Sexual activity: Not on file   Other Topics Concern  . Not on file   Social History Narrative  . No narrative on file     PHYSICAL EXAM  Vitals:   07/29/16 1439  BP: 120/88  Resp: 16  Weight: 139 lb (63 kg)  Height: 5\' 3"  (1.6 m)    Body mass index is 24.62 kg/m.   General: The patient is well-developed and well-nourished and in no acute distress   Neurologic Exam  Mental status: The patient is alert and oriented x 3 at the time of the examination. The patient has apparent normal recent and remote memory, with an apparently normal attention span and concentration ability.   Speech is normal.  Cranial nerves: Extraocular movements are full.  Facial symmetry is present. There is good facial sensation to soft touch bilaterally.Facial strength is normal.  Trapezius and sternocleidomastoid strength is normal. No dysarthria is noted.  The tongue is midline, and the patient has symmetric elevation of the soft  palate. No obvious hearing deficits are noted.  Motor:  Muscle bulk is normal.    Tone is increased in both legs. Strength is  5 / 5 in all 4 extremities.   Sensory: Sensory testing is  intact to touch, vibration sensation, and position sense on all 4 extremities.  Coordination: Cerebellar testing reveals good finger-nose-finger and mildly reduced heel-to-shin bilaterally.  Gait and station: Station is normal and gait is spastic and mildly wide. Tandem gait is wide. Romberg is negative.   Reflexes: Deep tendon reflexes are increased bilaterally in legs.  She has spread at the knees. Plantar responses are normal.    DIAGNOSTIC DATA (LABS, IMAGING, TESTING) - I reviewed patient records, labs, notes, testing and imaging myself where available.  Lab Results  Component Value Date   WBC 11.4 (H) 11/10/2015   HGB 12.0 10/09/2010   HCT 40.8 11/10/2015   MCV 91 11/10/2015   PLT 204 11/10/2015      Component Value Date/Time   PROT 6.7 09/29/2007 0837   ALBUMIN 4.1 09/29/2007 0837   AST 34 09/29/2007 0837   ALT 30 09/29/2007 0837   ALKPHOS 88 09/29/2007 0837   BILITOT 1.1 09/29/2007 0837   Lab Results  Component Value Date   CHOL 136 09/29/2007   HDL 41.5 09/29/2007   LDLCALC 69 09/29/2007   TRIG 126 09/29/2007   CHOLHDL 3.3 CALC 09/29/2007       ASSESSMENT AND PLAN  Multiple sclerosis (HCC) - Plan: Stratify JCV Antibody Test ([facility]), CBC with Differential/Platelet  Abnormality of gait  Bladder neurogenesis - Plan: Urinalysis, Routine w reflex microscopic, Culture, Urine  Numbness  Other fatigue  Acute cystitis without hematuria - Plan: Urinalysis, Routine w reflex microscopic, Culture, Urine   1.  continue Tysabri.    check JCV Ab titer and CBC today.   If the JCV has become positive, we can switch to ocrelizumab.     2.   Continue gabapentin for the dysesthetic pain. Add lamotrigine and titrate up to 100 mg by mouth twice a day. 3.  Refill  med's   4.  Check urinalysis and urine culture if abnormal.  5.  She will return to see me in 6 months, sooner if she has new or worsening neurologic symptoms.    Breckyn Troyer A. Epimenio Foot, MD, PhD 07/29/2016, 4:47  PM Certified in Neurology, Clinical Neurophysiology, Sleep Medicine, Pain Medicine and Neuroimaging  [facility] [address], [address] [place], [place] [address] [phone]

## 2016-07-30 LAB — CBC WITH DIFFERENTIAL/PLATELET
BASOS ABS: 0.1 10*3/uL (ref 0.0–0.2)
Basos: 1 %
EOS (ABSOLUTE): 0.6 10*3/uL — ABNORMAL HIGH (ref 0.0–0.4)
Eos: 7 %
HEMOGLOBIN: 12 g/dL (ref 11.1–15.9)
Hematocrit: 36.3 % (ref 34.0–46.6)
IMMATURE GRANS (ABS): 0 10*3/uL (ref 0.0–0.1)
IMMATURE GRANULOCYTES: 1 %
LYMPHS ABS: 3.4 10*3/uL — AB (ref 0.7–3.1)
LYMPHS: 39 %
MCH: 30.5 pg (ref 26.6–33.0)
MCHC: 33.1 g/dL (ref 31.5–35.7)
MCV: 92 fL (ref 79–97)
MONOCYTES: 7 %
Monocytes Absolute: 0.6 10*3/uL (ref 0.1–0.9)
NEUTROS PCT: 45 %
Neutrophils Absolute: 4 10*3/uL (ref 1.4–7.0)
Platelets: 188 10*3/uL (ref 150–379)
RBC: 3.94 x10E6/uL (ref 3.77–5.28)
RDW: 15 % (ref 12.3–15.4)
WBC: 8.7 10*3/uL (ref 3.4–10.8)

## 2016-07-30 LAB — URINALYSIS, ROUTINE W REFLEX MICROSCOPIC
Bilirubin, UA: NEGATIVE
Glucose, UA: NEGATIVE
KETONES UA: NEGATIVE
NITRITE UA: POSITIVE — AB
Protein, UA: NEGATIVE
RBC UA: NEGATIVE
UUROB: 1 mg/dL (ref 0.2–1.0)
pH, UA: 6.5 (ref 5.0–7.5)

## 2016-07-30 LAB — MICROSCOPIC EXAMINATION: Casts: NONE SEEN /lpf

## 2016-07-31 ENCOUNTER — Encounter: Payer: Self-pay | Admitting: Neurology

## 2016-07-31 ENCOUNTER — Telehealth: Payer: Self-pay | Admitting: *Deleted

## 2016-07-31 LAB — URINE CULTURE: Organism ID, Bacteria: NO GROWTH

## 2016-07-31 NOTE — Telephone Encounter (Signed)
I have spoken with Kellie [name] today and per RAS, explained urine cx is negative.  She verbalized understanding of same/fim

## 2016-07-31 NOTE — Telephone Encounter (Signed)
-----   Message from Asa Lente, MD sent at 07/31/2016 12:05 PM EDT ----- Please let her know that the urine culture was negative

## 2016-08-06 ENCOUNTER — Encounter: Payer: Self-pay | Admitting: *Deleted

## 2016-08-07 ENCOUNTER — Telehealth: Payer: Self-pay | Admitting: *Deleted

## 2016-08-07 NOTE — Telephone Encounter (Signed)
Methylphenidate QLE (#120/30, 10mg  QID) completed by phone with [facility], phone# [phone].  Approved thru 03/10/17.  Member ID# [id number]

## 2016-08-15 ENCOUNTER — Other Ambulatory Visit: Payer: Self-pay | Admitting: Neurology

## 2016-08-21 ENCOUNTER — Encounter: Payer: Self-pay | Admitting: Neurology

## 2016-08-29 ENCOUNTER — Encounter: Payer: Self-pay | Admitting: *Deleted

## 2016-11-06 ENCOUNTER — Other Ambulatory Visit: Payer: Self-pay | Admitting: Neurology

## 2016-11-06 MED ORDER — ARIPIPRAZOLE 5 MG PO TABS: 5.0000 mg | ORAL_TABLET | Freq: Every day | ORAL | 1 refills | Status: DC

## 2016-12-09 ENCOUNTER — Other Ambulatory Visit: Payer: Self-pay | Admitting: Neurology

## 2016-12-15 ENCOUNTER — Other Ambulatory Visit: Payer: Self-pay | Admitting: Neurology

## 2016-12-26 ENCOUNTER — Telehealth: Payer: Self-pay | Admitting: Neurology

## 2016-12-26 MED ORDER — METHYLPHENIDATE HCL 10 MG PO TABS: 10.0000 mg | ORAL_TABLET | Freq: Four times a day (QID) | ORAL | 0 refills | Status: DC

## 2016-12-26 NOTE — Telephone Encounter (Signed)
Rx. awaiting RAS sig/fim 

## 2016-12-26 NOTE — Telephone Encounter (Signed)
Pt request refill for methylphenidate (RITALIN) 10 MG tablet . She has an infusion tomorrow at 11 and wants to pick it up then. She is asking if the RX could be left with the infusion suite, she is concerned she may forgot to ask for it.

## 2016-12-26 NOTE — Telephone Encounter (Signed)
Rx. up front [facility].  I let Kellie [name] in the infusion suite know rx. is up front, and she will remind pt. to get it. This is a rx. that needs to be signed for when picked up./fim

## 2017-01-13 ENCOUNTER — Encounter: Payer: Self-pay | Admitting: *Deleted

## 2017-02-06 ENCOUNTER — Ambulatory Visit (INDEPENDENT_AMBULATORY_CARE_PROVIDER_SITE_OTHER): Payer: Medicare Other | Admitting: Neurology

## 2017-02-06 ENCOUNTER — Encounter: Payer: Self-pay | Admitting: Neurology

## 2017-02-06 VITALS — BP 136/83 | HR 73 | Wt 141.5 lb

## 2017-02-06 DIAGNOSIS — N319 Neuromuscular dysfunction of bladder, unspecified: Secondary | ICD-10-CM

## 2017-02-06 DIAGNOSIS — G35 Multiple sclerosis: Secondary | ICD-10-CM

## 2017-02-06 DIAGNOSIS — R269 Unspecified abnormalities of gait and mobility: Secondary | ICD-10-CM

## 2017-02-06 DIAGNOSIS — R5383 Other fatigue: Secondary | ICD-10-CM

## 2017-02-06 MED ORDER — METHYLPHENIDATE HCL 10 MG PO TABS
10.0000 mg | ORAL_TABLET | Freq: Four times a day (QID) | ORAL | 0 refills | Status: DC
Start: 2017-02-06 — End: 2017-08-07

## 2017-02-06 MED ORDER — ALPRAZOLAM 0.25 MG PO TABS: ORAL_TABLET | ORAL | 0 refills | Status: DC

## 2017-02-06 NOTE — Progress Notes (Signed)
[facility]  PATIENT: Kellie [name] DOB: 1961-03-08  REFERRING CLINICIAN: Vernona Rieger HISTORY FROM: Patient  REASON FOR VISIT: Multiple Sclerosis   HISTORICAL  CHIEF COMPLAINT:  Chief Complaint  Patient presents with  . Follow-up    HISTORY OF PRESENT ILLNESS:  Kellie [name] is a 56 year old woman with MS diagnoses in 1997.     Update 02/06/2017:    Her MS continues to do well. She has been on Tysabri since 2008 and she tolerates it well. Last JCV antibody test 08/21/2016 was negative at 0.16.     MRIs of the brain and cervical spine performed in 2016 showed plaques consistent with MS but nothing appeared to be acute. The cervical spine shows at least 4 plaques.  Her gait is doing ok and she does Jazzercise and strength training 5 days a week.   Strength ansd sensation are unchanged.  She fell last week but  only hit hip and no injury.   Years ago, she fractured her right hip with a fall.    Bladder is doing about the same but she still has a lot of constipation.    Vision is doing well  She notes a lot of stress, mostly financial as her supplemental insurance is high and she does not qualify for medicaid.  Depression is stable.  She has some decreased focus and attention..   She notes some fatigue.    Sleep is good with valium.   From 07/29/2016: MS:  She has been on Tysabri since 2008. She tolerates it well and her MS has been mostly stable while she has been on it.   The JCV Ab (11/10/2015) negative at 0.12   She denies any new exacerbations though gait has worsened some in last couple years.   The last MRI of the brain and cervical spine was performed in 2016. They showed no new lesions.  There are at least 4 plaques in the cervical spine.   Gait/strength/sensation:  She fell in March hitting her head and had a mild concussion (no LOC).   Balance is poor and she falls occasionally.    She exercise regularly with Jazzercise.  She has mild weakness and spasticity in  her legs.      Her the right leg seems weaker than the left.  No arm weakness but she sometimes drops items.    Left leg has reduced ROM form fracture in past.     She continues to have hand and leg tingling, helped by gabapentin.  The addition of lamotrigine has helped more.   She tolerates it well.    Gait issues are worse when she is tired or when hot.      Bladder/bowel:   She has had more UTI's.   She was recently switched from nitrofurantoin to Cipro.   She sees Dr. Perley Jain.   She continues to have problems with urinary urgency and frequency.  She does intermittent catheterization every few hous. She is currently on oxybutynin combined with Myrbetriq bid with benefit.    She has severe constipation but Linzess caused constipation.    With the desmopressin she doesn't have to wake up to catheterize herself or because of incontinence.  Fatigue:   She has fatigue, about the same as last visit, but she remains active. She does Jazzercise 5 days a week over 8 different classes. She feels better when she does her exercises.  Mood:   She has had depression in the past but is generally feeling  good currently.  Her son was recently injured doing a Building control surveyor.   She has financial worries.   She has some anxiety also.    Effexor helps some and is well tolerated  Cognition:   She has mild cognitive issues and has some verbal fluency issues.  She has mild short term memory issues and feels focus/atention is decreased.  Ritalin helps attention and focus some.   She feels these issues have been stable for the most part.  Pain:    The right leg is still painful intermittently.   She sees Dr. Dewaine Oats (Ortho)  A trochanteric bursa injection last year only helped a couple days.      Sleep:   She has insomnia that is better with Valium and desmopressin.   She falls asleep easily and stays asleep most of the night.      MS History:  She presented with left sided numbness in September 1997.   She underwent an MRI  and a lumbar puncture and was told MS was unlikely.   She had right sided numbness in January 1998 and had brain and spine MRI.    There were changes suggestive of MS and she was diagnosed in January 1998.   She was started on Betaseron.   She was on Betaseron x 10 years and then switched to Avonex for a year due to tolerability issues.   She had clinical exacerbations and MRI changes and then went on Tysabri in 2007 or 2008 and has had 92 infusions, her last infusion being 03/02/15.    She is transferring care from [facility] to [facility].  She tolerates Tysabri well.   She is JCV Ab negative.     An MRI of the spinal cord dated 02/02/2013 shows demyelinating plaque to the left adjacent to C3, anteriorly adjacent to C4, to the right adjacent to C5-C6, posteriorly to the right adjacent to C6-C7. When that study was compared to an MRI dated 12/29/2006, there was no definite interval change. MRI of the brain the same day was abnormal showing signal changes compatible with the diagnosis of multiple sclerosis. Compared to the prior study dated 02/23/2012, there was no definite interval change.      MRI brain and cervical spine (09/2014) showing 4 cervical spine foci and typical demyelination plaques in brain (brain unchanged compared to 2012)..    She had MRI 05/2016 after a fall.       REVIEW OF SYSTEMS:  Constitutional: No fevers, chills, sweats, or change in appetite.   Notes fatigue and some insomnia Eyes: No visual changes, double vision, eye pain Ear, nose and throat: No hearing loss, ear pain, nasal congestion, sore throat Cardiovascular: No chest pain, palpitations Respiratory:  No shortness of breath at rest or with exertion.   No wheezes GastrointestinaI: No nausea, vomiting, diarrhea, abdominal pain.   She has dysphagia Genitourinary: as above. Musculoskeletal:  as above iiIntegumentary: No rash, pruritus, skin lesions Neurological: as above Psychiatric: Mild depression at this time.  Mild  anxiety Endocrine: No palpitations, diaphoresis, change in appetite, change in weigh or increased thirst Hematologic/Lymphatic:  No anemia, purpura, petechiae. Allergic/Immunologic: No itchy/runny eyes, nasal congestion, recent allergic reactions, rashes  ALLERGIES: Allergies  Allergen Reactions  . Erythromycin Nausea And Vomiting  . Oxycodone-Acetaminophen     Other reaction(s): Hallucinations  . Clindamycin/Lincomycin   . Sulfa Antibiotics     HOME MEDICATIONS:  Current Outpatient Medications:  .  ALPRAZolam (XANAX) 0.25 MG tablet, TAKE 1 TABLET BY  MOUTH AT BEDTIME AS NEEDED FOR ANXIETY, Disp: 30 tablet, Rfl: 0 .  Calcium Carbonate-Vitamin D 600-125 MG-UNIT TABS, Take by mouth., Disp: , Rfl:  .  cetirizine (ZYRTEC) 1 MG/ML syrup, Take 5 mg by mouth., Disp: , Rfl:  .  ciprofloxacin (CIPRO) 250 MG tablet, Take 1 tablet by mouth daily as needed., Disp: , Rfl:  .  desmopressin (DDAVP) 0.1 MG tablet, TAKE 1 TABLET EVERY DAY, Disp: 90 tablet, Rfl: 3 .  diazepam (VALIUM) 5 MG tablet, Take 1 tablet (5 mg total) by mouth 2 (two) times daily as needed., Disp: 180 tablet, Rfl: 1 .  famciclovir (FAMVIR) 250 MG tablet, TAKE 1 TABLET BY MOUTH TWICE A DAY, Disp: , Rfl:  .  Fish Oil-Cholecalciferol (FISH OIL + D3) 1000-1000 MG-UNIT CAPS, Take by mouth., Disp: , Rfl:  .  gabapentin (NEURONTIN) 600 MG tablet, Take 2 tablets 4 times daily, Disp: 720 tablet, Rfl: 3 .  lamoTRIgine (LAMICTAL) 100 MG tablet, Take 1 tablet (100 mg total) by mouth 2 (two) times daily., Disp: 180 tablet, Rfl: 3 .  methylphenidate (RITALIN) 10 MG tablet, Take 1 tablet (10 mg total) by mouth 4 (four) times daily., Disp: 360 tablet, Rfl: 0 .  MYRBETRIQ 50 MG TB24 tablet, TAKE 1 TABLET ONCE DAILY., Disp: 30 tablet, Rfl: 6 .  natalizumab 300 mg in sodium chloride 0.9 % 100 mL, Inject into the vein., Disp: , Rfl:  .  nitrofurantoin (MACRODANTIN) 100 MG capsule, Take 1 capsule (100 mg total) by mouth at bedtime., Disp: 90 capsule,  Rfl: 0 .  omeprazole (PRILOSEC) 20 MG capsule, 2 (two) times daily., Disp: , Rfl:  .  oxybutynin (DITROPAN-XL) 10 MG 24 hr tablet, Take 2 tablets (20 mg total) by mouth daily. As directed, Disp: 180 tablet, Rfl: 1 .  simvastatin (ZOCOR) 40 MG tablet, TAKE 1 TABLET AT BEDTIME FOR CHOLESTEROL, Disp: , Rfl:  .  venlafaxine XR (EFFEXOR-XR) 150 MG 24 hr capsule, TAKE 2 CAPSULES (300 MG TOTAL) BY MOUTH DAILY., Disp: 180 capsule, Rfl: 3 .  vitamin B-12 (CYANOCOBALAMIN) 1000 MCG tablet, Take 1,000 mcg by mouth daily., Disp: , Rfl:    PAST MEDICAL HISTORY: Past Medical History:  Diagnosis Date  . Chronic kidney disease   . Complication of anesthesia    pt states she got thrush after anesthesia  . Multiple sclerosis (HCC)   . Neuropathy   . Vision abnormalities     PAST SURGICAL HISTORY: Past Surgical History:  Procedure Laterality Date  . CARPAL TUNNEL RELEASE Right    2010  . CARPAL TUNNEL RELEASE Left   . CESAREAN SECTION    . COLONOSCOPY  07/26/14  . ENDOMETRIAL ABLATION     2007  . HIP FRACTURE SURGERY    . hysterctomy    . TUBAL LIGATION      FAMILY HISTORY: Family History  Problem Relation Age of Onset  . Lung cancer Mother   . Dementia Father     SOCIAL HISTORY:  Social History   Socioeconomic History  . Marital status: Legally Separated    Spouse name: Not on file  . Number of children: Not on file  . Years of education: Not on file  . Highest education level: Not on file  Social Needs  . Financial resource strain: Not on file  . Food insecurity - worry: Not on file  . Food insecurity - inability: Not on file  . Transportation needs - medical: Not on file  . Transportation needs -  non-medical: Not on file  Occupational History  . Not on file  Tobacco Use  . Smoking status: Former Smoker    Last attempt to quit: 03/29/1981    Years since quitting: 35.8  . Smokeless tobacco: Never Used  Substance and Sexual Activity  . Alcohol use: Yes    Alcohol/week: 0.0  oz    Comment: Occasional  . Drug use: No  . Sexual activity: Not on file  Other Topics Concern  . Not on file  Social History Narrative  . Not on file     PHYSICAL EXAM  Vitals:   02/06/17 1121  BP: 136/83  Pulse: 73  Weight: 141 lb 8 oz (64.2 kg)    Body mass index is 25.07 kg/m.   General: The patient is well-developed and well-nourished and in no acute distress   Neurologic Exam  Mental status: The patient is alert and oriented x 3 at the time of the examination. The patient has apparent normal recent and remote memory, with an apparently normal attention span and concentration ability.   Speech is normal.  Cranial nerves: Extraocular movements are full. Facial strength and sensation is normal. Trapezius strength is normal.. No dysarthria is noted.  The tongue is midline, and the patient has symmetric elevation of the soft palate. No obvious hearing deficits are noted.  Motor:  Muscle bulk is normal.    Tone is increased in both legs. Strength is  5 / 5 in all 4 extremities.   Sensory: Sensory testing is intact to touch, vibration sensation, and position sense on all 4 extremities.  Coordination: Cerebellar testing reveals good finger-nose-finger and mildly reduced heel-to-shin bilaterally.  Gait and station: Station is normal.   The gait is mildly wide. Tandem gait is moderately wide.  Romberg is negative.  Reflexes: Deep tendon reflexes are increased bilaterally in legs.  She has spread at the knees. Plantar responses are normal.    DIAGNOSTIC DATA (LABS, IMAGING, TESTING) - I reviewed patient records, labs, notes, testing and imaging myself where available.  Lab Results  Component Value Date   WBC 8.7 07/29/2016   HGB 12.0 07/29/2016   HCT 36.3 07/29/2016   MCV 92 07/29/2016   PLT 188 07/29/2016      Component Value Date/Time   PROT 6.7 09/29/2007 0837   ALBUMIN 4.1 09/29/2007 0837   AST 34 09/29/2007 0837   ALT 30 09/29/2007 0837   ALKPHOS 88  09/29/2007 0837   BILITOT 1.1 09/29/2007 0837   Lab Results  Component Value Date   CHOL 136 09/29/2007   HDL 41.5 09/29/2007   LDLCALC 69 09/29/2007   TRIG 126 09/29/2007   CHOLHDL 3.3 CALC 09/29/2007       ASSESSMENT AND PLAN  Abnormality of gait  Multiple sclerosis (HCC) - Plan: MR BRAIN W WO CONTRAST, MR CERVICAL SPINE W WO CONTRAST  Bladder neurogenesis  Other fatigue   1.  continue Tysabri.   We will check his JCV antibody titer and a CBC today..   If the JCV has become positive, we can switch to ocrelizumab or Lemtrada.    Reduced check an MRI of the brain to determine if there has been any subclinical activity since her last MRI 2 years ago. If this is occurring we will need to consider a change to ocrelizumab or Lemtrada. 2.   She will continue gabapentin and lamotrigine.  3.  Refill  med's   4.  Stay active and exercises tolerated. 5.  She will  return to see me in 6 months, sooner if she has new or worsening neurologic symptoms.    Santoria Chason A. Epimenio Foot, MD, PhD 02/06/2017, 11:48 AM Certified in Neurology, Clinical Neurophysiology, Sleep Medicine, Pain Medicine and Neuroimaging  [facility] [address] [place], [place] [address] [phone]

## 2017-02-07 LAB — CBC WITH DIFFERENTIAL/PLATELET
BASOS: 1 %
Basophils Absolute: 0.1 10*3/uL (ref 0.0–0.2)
EOS (ABSOLUTE): 0.5 10*3/uL — ABNORMAL HIGH (ref 0.0–0.4)
EOS: 6 %
HEMATOCRIT: 35.8 % (ref 34.0–46.6)
Hemoglobin: 12.2 g/dL (ref 11.1–15.9)
IMMATURE GRANS (ABS): 0 10*3/uL (ref 0.0–0.1)
IMMATURE GRANULOCYTES: 1 %
Lymphocytes Absolute: 3.5 10*3/uL — ABNORMAL HIGH (ref 0.7–3.1)
Lymphs: 44 %
MCH: 30.6 pg (ref 26.6–33.0)
MCHC: 34.1 g/dL (ref 31.5–35.7)
MCV: 90 fL (ref 79–97)
MONOS ABS: 0.5 10*3/uL (ref 0.1–0.9)
Monocytes: 7 %
NEUTROS PCT: 41 %
Neutrophils Absolute: 3.2 10*3/uL (ref 1.4–7.0)
Platelets: 192 10*3/uL (ref 150–379)
RBC: 3.99 x10E6/uL (ref 3.77–5.28)
RDW: 15 % (ref 12.3–15.4)
WBC: 7.8 10*3/uL (ref 3.4–10.8)

## 2017-02-12 ENCOUNTER — Encounter: Payer: Self-pay | Admitting: *Deleted

## 2017-03-13 ENCOUNTER — Other Ambulatory Visit: Payer: Self-pay | Admitting: Neurology

## 2017-03-13 DIAGNOSIS — Z139 Encounter for screening, unspecified: Secondary | ICD-10-CM

## 2017-03-20 ENCOUNTER — Ambulatory Visit
Admission: RE | Admit: 2017-03-20 | Discharge: 2017-03-20 | Disposition: A | Discharge status: Home / Self Care | Payer: Medicare Other | Source: Ambulatory Visit | Attending: Neurology | Admitting: Neurology

## 2017-03-20 DIAGNOSIS — G35 Multiple sclerosis: Secondary | ICD-10-CM | POA: Diagnosis not present

## 2017-03-20 MED ORDER — GADOBENATE DIMEGLUMINE 529 MG/ML IV SOLN
13.0000 mL | Freq: Once | INTRAVENOUS | Status: AC | PRN
  Administered 2017-03-20: 13 mL via INTRAVENOUS

## 2017-03-24 ENCOUNTER — Telehealth: Payer: Self-pay | Admitting: *Deleted

## 2017-03-24 NOTE — Telephone Encounter (Signed)
-----   Message from Asa Lente, MD sent at 03/23/2017  3:43 PM EST ----- Please let her know the MRI of the brain and cervical spine show no new MS plaques.  However, she does have a little more degenerative change at C6C7 than the last MRI --- but not bad enough for surgery

## 2017-03-24 NOTE — Telephone Encounter (Signed)
LMOM (identified vm) with below MRI report.  She does not need to return this call unless she has questions/fim 

## 2017-03-28 NOTE — Telephone Encounter (Signed)
I have spoken with Kellie [name] this morning and reviewed below MRI results.  Explained that degen changes are wear and tear, age related changes, some worse since last mri, but not bad enough for surgery.  She is reassured by this. She verbalized understanding of same.

## 2017-03-28 NOTE — Telephone Encounter (Signed)
Pt called back, she does have some questions. Patient is very concerned that she may need surgery. Please call to advise at [phone]. Pt is aware the clinic closes today at noon

## 2017-04-02 ENCOUNTER — Ambulatory Visit: Payer: Medicare Other

## 2017-04-10 ENCOUNTER — Telehealth: Payer: Self-pay | Admitting: Neurology

## 2017-04-10 MED ORDER — DIAZEPAM 5 MG PO TABS: 5.0000 mg | ORAL_TABLET | Freq: Two times a day (BID) | ORAL | 1 refills | Status: DC | PRN

## 2017-04-10 NOTE — Telephone Encounter (Signed)
Pt request refill for diazepam (VALIUM) 5 MG tablet 90 day supply. Pt has infusion tomorrow at 11, she is wanting to pick it up then.

## 2017-04-10 NOTE — Telephone Encounter (Signed)
Rx at the front desk.

## 2017-04-10 NOTE — Telephone Encounter (Signed)
Rx printed, awaiting sig 

## 2017-04-23 ENCOUNTER — Ambulatory Visit
Admission: RE | Admit: 2017-04-23 | Discharge: 2017-04-23 | Disposition: A | Discharge status: Home / Self Care | Payer: Medicare Other | Source: Ambulatory Visit | Attending: Neurology | Admitting: Neurology

## 2017-04-23 DIAGNOSIS — Z139 Encounter for screening, unspecified: Secondary | ICD-10-CM

## 2017-04-24 ENCOUNTER — Telehealth: Payer: Self-pay | Admitting: *Deleted

## 2017-04-24 NOTE — Telephone Encounter (Signed)
-----   Message from Asa Lente, MD sent at 04/24/2017 11:43 AM EST ----- Please let her know that the mammogram was fine.

## 2017-04-24 NOTE — Telephone Encounter (Signed)
Spoke with Kellie [name] and gave below Mammogram result.  She verbalized understanding of same/fim

## 2017-05-07 ENCOUNTER — Other Ambulatory Visit: Payer: Self-pay | Admitting: Neurology

## 2017-06-12 ENCOUNTER — Other Ambulatory Visit: Payer: Self-pay | Admitting: Neurology

## 2017-07-07 ENCOUNTER — Encounter: Payer: Self-pay | Admitting: *Deleted

## 2017-08-07 ENCOUNTER — Other Ambulatory Visit: Payer: Self-pay

## 2017-08-07 ENCOUNTER — Encounter: Payer: Self-pay | Admitting: Neurology

## 2017-08-07 ENCOUNTER — Ambulatory Visit (INDEPENDENT_AMBULATORY_CARE_PROVIDER_SITE_OTHER): Payer: Medicare Other | Admitting: Neurology

## 2017-08-07 ENCOUNTER — Telehealth: Payer: Self-pay | Admitting: Neurology

## 2017-08-07 VITALS — BP 138/81 | HR 75 | Resp 16 | Ht 63.0 in | Wt 141.0 lb

## 2017-08-07 DIAGNOSIS — R5383 Other fatigue: Secondary | ICD-10-CM

## 2017-08-07 DIAGNOSIS — N319 Neuromuscular dysfunction of bladder, unspecified: Secondary | ICD-10-CM | POA: Diagnosis not present

## 2017-08-07 DIAGNOSIS — R200 Anesthesia of skin: Secondary | ICD-10-CM | POA: Diagnosis not present

## 2017-08-07 DIAGNOSIS — R269 Unspecified abnormalities of gait and mobility: Secondary | ICD-10-CM

## 2017-08-07 DIAGNOSIS — Z79899 Other long term (current) drug therapy: Secondary | ICD-10-CM | POA: Insufficient documentation

## 2017-08-07 DIAGNOSIS — G35 Multiple sclerosis: Secondary | ICD-10-CM | POA: Diagnosis not present

## 2017-08-07 MED ORDER — DESMOPRESSIN ACETATE 0.1 MG PO TABS: 100.0000 ug | ORAL_TABLET | Freq: Every day | ORAL | 3 refills | Status: DC

## 2017-08-07 MED ORDER — METHYLPHENIDATE HCL 10 MG PO TABS: 10.0000 mg | ORAL_TABLET | Freq: Four times a day (QID) | ORAL | 0 refills | Status: DC

## 2017-08-07 MED ORDER — GABAPENTIN 600 MG PO TABS: ORAL_TABLET | ORAL | 3 refills | Status: DC

## 2017-08-07 MED ORDER — ALPRAZOLAM 0.25 MG PO TABS: ORAL_TABLET | ORAL | 0 refills | Status: DC

## 2017-08-07 NOTE — Telephone Encounter (Signed)
Kellie [name] with [phone]) called stating that gabapentin (NEURONTIN) 600 MG tablet is needing precerification stating the max dose dosing is 6 tablets daily. Please contact [phone] to get medication going through.

## 2017-08-07 NOTE — Telephone Encounter (Signed)
Spoke with [facility] pharmacist and advised them of Gabapentin QLE exception approval/fim

## 2017-08-07 NOTE — Telephone Encounter (Signed)
Gabapentin QLE completed with [facility], phone# [phone].  [employer] Medicare ID: [id number].  Plan limit is 180/30 and she is taking 240/30 (600mg , 2 tabs QID). Dx: Neuropathy (G62.9), MS (G35). Pt. has been on the current dose since 2017.  She was started on a lower dose and has been titrated up to the current effective dose.  QLE approved for dates 03/11/17 thru 03/10/18.  Approval letter to follow/fim

## 2017-08-07 NOTE — Progress Notes (Signed)
[facility]  PATIENT: Kellie [name] DOB: 09-Jul-1960  REFERRING CLINICIAN: Vernona Rieger HISTORY FROM: Patient  REASON FOR VISIT: Multiple Sclerosis   HISTORICAL  CHIEF COMPLAINT:  Chief Complaint  Patient presents with  . Multiple Sclerosis    Sts. she continues to tolerate Tysabri well.  JCV ab last checked 02/06/17 and was negative at 0.14/fim    HISTORY OF PRESENT ILLNESS:  Kellie [name] is a 57 y.o. woman with MS diagnoses in 1997.     Update 08/07/2017:   She feels her MS is mostly stable.  She denies any exacerbations.  She tolerates it well.  She has been JCV antibody negative, last tested about 6 months ago.  She would like to get Tysabri a couple days early as she feels more tired the last few days of every cycle and also notes more mental fog the last few days of every cycle.  MRI of the brain and cervical spine 03/20/2017 did not show new lesions.  She feels her gait and balance are doing the same.    She trips a lot and stumbles but only has some falls.  She fell when stepping over her dog and hit her chest hard.    Bladder frequency and urgency helped by Myrbetriq and oxybutynin.    She needs to catheterize.   She sees Dr. Sherron Monday.   Vision I doing well.   She sees ophthalmology regularly.      She has fatigue, especially the last few She notes mental fog with reduced STM and word finding difficulty.  She has trouble with parallel processing and executive function tasks as well.  Ritalin has helped a little bit but she takes only 20 mg most morning and skips the pm dose.    She has depression that is stable .   She notes some financial stress, especially with her medicare supplement expenses,      Update 02/06/2017:    Her MS continues to do well. She has been on Tysabri since 2008 and she tolerates it well. Last JCV antibody test 08/21/2016 was negative at 0.16.     MRIs of the brain and cervical spine performed in 2016 showed plaques consistent with MS  but nothing appeared to be acute. The cervical spine shows at least 4 plaques.  Her gait is doing ok and she does Jazzercise and strength training 5 days a week.   Strength ansd sensation are unchanged.  She fell last week but  only hit hip and no injury.   Years ago, she fractured her right hip with a fall.    Bladder is doing about the same but she still has a lot of constipation.    Vision is doing well  She notes a lot of stress, mostly financial as her supplemental insurance is high and she does not qualify for medicaid.  Depression is stable.  She has some decreased focus and attention..   She notes some fatigue.    Sleep is good with valium.   From 07/29/2016: MS:  She has been on Tysabri since 2008. She tolerates it well and her MS has been mostly stable while she has been on it.   The JCV Ab (11/10/2015) negative at 0.12   She denies any new exacerbations though gait has worsened some in last couple years.   The last MRI of the brain and cervical spine was performed in 2016. They showed no new lesions.  There are at least 4 plaques in the  cervical spine.   Gait/strength/sensation:  She fell in March hitting her head and had a mild concussion (no LOC).   Balance is poor and she falls occasionally.    She exercise regularly with Jazzercise.  She has mild weakness and spasticity in her legs.      Her the right leg seems weaker than the left.  No arm weakness but she sometimes drops items.    Left leg has reduced ROM form fracture in past.     She continues to have hand and leg tingling, helped by gabapentin.  The addition of lamotrigine has helped more.   She tolerates it well.    Gait issues are worse when she is tired or when hot.      Bladder/bowel:   She has had more UTI's.   She was recently switched from nitrofurantoin to Cipro.   She sees Dr. Perley Jain.   She continues to have problems with urinary urgency and frequency.  She does intermittent catheterization every few hous. She is currently on  oxybutynin combined with Myrbetriq bid with benefit.    She has severe constipation but Linzess caused constipation.    With the desmopressin she doesn't have to wake up to catheterize herself or because of incontinence.  Fatigue:   She has fatigue, about the same as last visit, but she remains active. She does Jazzercise 5 days a week over 8 different classes. She feels better when she does her exercises.  Mood:   She has had depression in the past but is generally feeling good currently.  Her son was recently injured doing a Building control surveyor.   She has financial worries.   She has some anxiety also.    Effexor helps some and is well tolerated  Cognition:   She has mild cognitive issues and has some verbal fluency issues.  She has mild short term memory issues and feels focus/atention is decreased.  Ritalin helps attention and focus some.   She feels these issues have been stable for the most part.  Pain:    The right leg is still painful intermittently.   She sees Dr. Dewaine Oats (Ortho)  A trochanteric bursa injection last year only helped a couple days.      Sleep:   She has insomnia that is better with Valium and desmopressin.   She falls asleep easily and stays asleep most of the night.      MS History:  She presented with left sided numbness in September 1997.   She underwent an MRI and a lumbar puncture and was told MS was unlikely.   She had right sided numbness in January 1998 and had brain and spine MRI.    There were changes suggestive of MS and she was diagnosed in January 1998.   She was started on Betaseron.   She was on Betaseron x 10 years and then switched to Avonex for a year due to tolerability issues.   She had clinical exacerbations and MRI changes and then went on Tysabri in 2007 or 2008 and has had 92 infusions, her last infusion being 03/02/15.    She is transferring care from [facility] to [facility].  She tolerates Tysabri well.   She is JCV Ab negative.     An MRI of the spinal  cord dated 02/02/2013 shows demyelinating plaque to the left adjacent to C3, anteriorly adjacent to C4, to the right adjacent to C5-C6, posteriorly to the right adjacent to C6-C7. When that study was  compared to an MRI dated 12/29/2006, there was no definite interval change. MRI of the brain the same day was abnormal showing signal changes compatible with the diagnosis of multiple sclerosis. Compared to the prior study dated 02/23/2012, there was no definite interval change.      MRI brain and cervical spine (09/2014) showing 4 cervical spine foci and typical demyelination plaques in brain (brain unchanged compared to 2012)..    She had MRI 05/2016 after a fall.       REVIEW OF SYSTEMS:  Constitutional: No fevers, chills, sweats, or change in appetite.   Notes fatigue and some insomnia Eyes: No visual changes, double vision, eye pain Ear, nose and throat: No hearing loss, ear pain, nasal congestion, sore throat Cardiovascular: No chest pain, palpitations Respiratory:  No shortness of breath at rest or with exertion.   No wheezes GastrointestinaI: No nausea, vomiting, diarrhea, abdominal pain.   She has dysphagia Genitourinary: as above. Musculoskeletal:  as above iiIntegumentary: No rash, pruritus, skin lesions Neurological: as above Psychiatric: Mild depression at this time.  Mild anxiety Endocrine: No palpitations, diaphoresis, change in appetite, change in weigh or increased thirst Hematologic/Lymphatic:  No anemia, purpura, petechiae. Allergic/Immunologic: No itchy/runny eyes, nasal congestion, recent allergic reactions, rashes  ALLERGIES: Allergies  Allergen Reactions  . Erythromycin Nausea And Vomiting  . Clindamycin/Lincomycin   . Dilaudid [Hydromorphone Hcl] Other (See Comments)    Hallucinations  . Sulfa Antibiotics     HOME MEDICATIONS:  Current Outpatient Medications:  .  ALPRAZolam (XANAX) 0.25 MG tablet, TAKE 1 TABLET BY MOUTH AT BEDTIME AS NEEDED FOR ANXIETY, Disp: 30  tablet, Rfl: 0 .  Calcium Carbonate-Vitamin D 600-125 MG-UNIT TABS, Take by mouth., Disp: , Rfl:  .  cetirizine (ZYRTEC) 1 MG/ML syrup, Take 5 mg by mouth., Disp: , Rfl:  .  ciprofloxacin (CIPRO) 250 MG tablet, Take 1 tablet by mouth daily as needed., Disp: , Rfl:  .  desmopressin (DDAVP) 0.1 MG tablet, Take 1 tablet (100 mcg total) by mouth daily., Disp: 90 tablet, Rfl: 3 .  diazepam (VALIUM) 5 MG tablet, Take 1 tablet (5 mg total) by mouth 2 (two) times daily as needed., Disp: 180 tablet, Rfl: 1 .  famciclovir (FAMVIR) 250 MG tablet, TAKE 1 TABLET BY MOUTH TWICE A DAY, Disp: , Rfl:  .  Fish Oil-Cholecalciferol (FISH OIL + D3) 1000-1000 MG-UNIT CAPS, Take by mouth., Disp: , Rfl:  .  gabapentin (NEURONTIN) 600 MG tablet, Take 2 tablets 4 times daily, Disp: 720 tablet, Rfl: 3 .  Ginger, Zingiber officinalis, (GINGER ROOT) 550 MG CAPS, Take by mouth., Disp: , Rfl:  .  lamoTRIgine (LAMICTAL) 100 MG tablet, Take 1 tablet (100 mg total) by mouth 2 (two) times daily., Disp: 180 tablet, Rfl: 3 .  methylphenidate (RITALIN) 10 MG tablet, Take 1 tablet (10 mg total) by mouth 4 (four) times daily., Disp: 360 tablet, Rfl: 0 .  MYRBETRIQ 50 MG TB24 tablet, TAKE 1 TABLET ONCE DAILY., Disp: 30 tablet, Rfl: 6 .  natalizumab 300 mg in sodium chloride 0.9 % 100 mL, Inject into the vein., Disp: , Rfl:  .  nitrofurantoin (MACRODANTIN) 100 MG capsule, Take 1 capsule (100 mg total) by mouth at bedtime., Disp: 90 capsule, Rfl: 0 .  omeprazole (PRILOSEC) 20 MG capsule, 2 (two) times daily., Disp: , Rfl:  .  oxybutynin (DITROPAN-XL) 10 MG 24 hr tablet, Take 2 tablets (20 mg total) by mouth daily. As directed, Disp: 180 tablet, Rfl: 1 .  simvastatin (  ZOCOR) 40 MG tablet, TAKE 1 TABLET AT BEDTIME FOR CHOLESTEROL, Disp: , Rfl:  .  venlafaxine XR (EFFEXOR-XR) 150 MG 24 hr capsule, TAKE 2 CAPSULES (300 MG TOTAL) BY MOUTH DAILY., Disp: 180 capsule, Rfl: 3 .  vitamin B-12 (CYANOCOBALAMIN) 1000 MCG tablet, Take 1,000 mcg by mouth  daily., Disp: , Rfl:  .  ARIPiprazole (ABILIFY) 5 MG tablet, TAKE 1 TABLET BY MOUTH EVERY DAY (Patient not taking: Reported on 08/07/2017), Disp: 90 tablet, Rfl: 1   PAST MEDICAL HISTORY: Past Medical History:  Diagnosis Date  . Chronic kidney disease   . Complication of anesthesia    pt states she got thrush after anesthesia  . Multiple sclerosis (HCC)   . Neuropathy   . Vision abnormalities     PAST SURGICAL HISTORY: Past Surgical History:  Procedure Laterality Date  . CARPAL TUNNEL RELEASE Right    2010  . CARPAL TUNNEL RELEASE Left   . CESAREAN SECTION    . COLONOSCOPY  07/26/14  . ENDOMETRIAL ABLATION     2007  . HIP FRACTURE SURGERY    . hysterctomy    . TUBAL LIGATION      FAMILY HISTORY: Family History  Problem Relation Age of Onset  . Lung cancer Mother   . Dementia Father   . Breast cancer Cousin     SOCIAL HISTORY:  Social History   Socioeconomic History  . Marital status: Legally Separated    Spouse name: Not on file  . Number of children: Not on file  . Years of education: Not on file  . Highest education level: Not on file  Occupational History  . Not on file  Social Needs  . Financial resource strain: Not on file  . Food insecurity:    Worry: Not on file    Inability: Not on file  . Transportation needs:    Medical: Not on file    Non-medical: Not on file  Tobacco Use  . Smoking status: Former Smoker    Last attempt to quit: 03/29/1981    Years since quitting: 36.3  . Smokeless tobacco: Never Used  Substance and Sexual Activity  . Alcohol use: Yes    Alcohol/week: 0.0 oz    Comment: Occasional  . Drug use: No  . Sexual activity: Not on file  Lifestyle  . Physical activity:    Days per week: Not on file    Minutes per session: Not on file  . Stress: Not on file  Relationships  . Social connections:    Talks on phone: Not on file    Gets together: Not on file    Attends religious service: Not on file    Active member of club or  organization: Not on file    Attends meetings of clubs or organizations: Not on file    Relationship status: Not on file  . Intimate partner violence:    Fear of current or ex partner: Not on file    Emotionally abused: Not on file    Physically abused: Not on file    Forced sexual activity: Not on file  Other Topics Concern  . Not on file  Social History Narrative  . Not on file     PHYSICAL EXAM  Vitals:   08/07/17 1124  BP: 138/81  Pulse: 75  Resp: 16  Weight: 141 lb (64 kg)  Height: 5\' 3"  (1.6 m)    Body mass index is 24.98 kg/m.   General: The patient is well-developed  and well-nourished and in no acute distress   Neurologic Exam  Mental status: The patient is alert and oriented x 3 at the time of the examination. The patient has apparent normal recent and remote memory, with an apparently normal attention span and concentration ability.   Speech is normal.  Cranial nerves: Extraocular movements are full.  Facial strength and sensation is normal.  The trapezius strength is normal.  The tongue is midline, and the patient has symmetric elevation of the soft palate. No obvious hearing deficits are noted.  Motor:  Muscle bulk is normal.    Tone is increased in both legs. Strength is  5 / 5 in all 4 extremities.   Sensory: Sensory testing is intact to touch, vibration sensation, and position sense on all 4 extremities.  Coordination: Cerebellar testing shows good finger-nose-finger but mildly reduced heel-to-shin.  Gait and station: Station is normal.   The gait is mildly wide and the tandem gait is moderately wide.  Romberg is negative.  Reflexes: Deep tendon reflexes are increased bilaterally in legs.  There is spread of the reflexes at the knees.. Plantar responses are normal.    DIAGNOSTIC DATA (LABS, IMAGING, TESTING) - I reviewed patient records, labs, notes, testing and imaging myself where available.  Lab Results  Component Value Date   WBC 7.8  02/06/2017   HGB 12.2 02/06/2017   HCT 35.8 02/06/2017   MCV 90 02/06/2017   PLT 192 02/06/2017      Component Value Date/Time   PROT 6.7 09/29/2007 0837   ALBUMIN 4.1 09/29/2007 0837   AST 34 09/29/2007 0837   ALT 30 09/29/2007 0837   ALKPHOS 88 09/29/2007 0837   BILITOT 1.1 09/29/2007 0837   Lab Results  Component Value Date   CHOL 136 09/29/2007   HDL 41.5 09/29/2007   LDLCALC 69 09/29/2007   TRIG 126 09/29/2007   CHOLHDL 3.3 CALC 09/29/2007       ASSESSMENT AND PLAN  Multiple sclerosis (HCC) - Plan: Stratify JCV Antibody Test ([facility]), CBC with Differential/Platelet  Bladder neurogenesis  Abnormality of gait  Numbness  Other fatigue  High risk medication use - Plan: Stratify JCV Antibody Test ([facility]), CBC with Differential/Platelet   1.   Continue Tysabri.  We discussed that the dose should be every 28 days.  We will check a JCV antibody again today.  If she does convert to positive we would consider a change to either Ocrevus or [place].   2.   She will continue her medications.  We discussed that she can take up to 4 of the 10 mg methylphenidate daily.  Currently she just takes 2.  Some of her cognitive issues may do better if she takes an additional 1 to 2 pills in the afternoon. 3.  Refill  med's   4.  Stay active and exercises tolerated. 5.  Form signed for handicap parking  6.  she will return to see me in 6 months, sooner if she has new or worsening neurologic symptoms.    Richard A. Epimenio Foot, MD, PhD 08/07/2017, 12:51 PM Certified in Neurology, Clinical Neurophysiology, Sleep Medicine, Pain Medicine and Neuroimaging  [facility] [address] [place], [place] [address] [phone]

## 2017-08-08 LAB — CBC WITH DIFFERENTIAL/PLATELET
BASOS: 1 %
Basophils Absolute: 0.1 10*3/uL (ref 0.0–0.2)
EOS (ABSOLUTE): 0.4 10*3/uL (ref 0.0–0.4)
EOS: 5 %
HEMATOCRIT: 35.1 % (ref 34.0–46.6)
HEMOGLOBIN: 12 g/dL (ref 11.1–15.9)
Immature Grans (Abs): 0.1 10*3/uL (ref 0.0–0.1)
Immature Granulocytes: 1 %
LYMPHS ABS: 3 10*3/uL (ref 0.7–3.1)
Lymphs: 39 %
MCH: 30.8 pg (ref 26.6–33.0)
MCHC: 34.2 g/dL (ref 31.5–35.7)
MCV: 90 fL (ref 79–97)
MONOCYTES: 7 %
MONOS ABS: 0.5 10*3/uL (ref 0.1–0.9)
NEUTROS ABS: 3.9 10*3/uL (ref 1.4–7.0)
Neutrophils: 47 %
Platelets: 183 10*3/uL (ref 150–450)
RBC: 3.9 x10E6/uL (ref 3.77–5.28)
RDW: 15.6 % — ABNORMAL HIGH (ref 12.3–15.4)
WBC: 7.8 10*3/uL (ref 3.4–10.8)

## 2017-08-13 ENCOUNTER — Encounter: Payer: Self-pay | Admitting: *Deleted

## 2017-08-14 ENCOUNTER — Encounter: Payer: Self-pay | Admitting: Neurology

## 2017-08-21 ENCOUNTER — Telehealth: Payer: Self-pay | Admitting: *Deleted

## 2017-08-21 NOTE — Telephone Encounter (Signed)
PA and QLE for Methylphenidate 10mg  #120/30 completed via phone with [facility] Medicare,phone# [phone].  Dx: ADD (F98.8).  #120 tablets needed instead of less tablets of a higher dose b/c taking in split doses has been more effective for pt.  PA approved for dates 03/09/17 thru 03/10/18, and QLE approved for dates 03/31/17 thru 03/10/18./fim

## 2017-10-26 ENCOUNTER — Other Ambulatory Visit: Payer: Self-pay | Admitting: Neurology

## 2017-11-29 ENCOUNTER — Other Ambulatory Visit: Payer: Self-pay | Admitting: Neurology

## 2017-12-15 ENCOUNTER — Telehealth: Payer: Self-pay | Admitting: *Deleted

## 2017-12-15 MED ORDER — METHYLPHENIDATE HCL 10 MG PO TABS
10.0000 mg | ORAL_TABLET | Freq: Four times a day (QID) | ORAL | 0 refills | Status: DC
Start: 2017-12-15 — End: 2017-12-15

## 2017-12-15 MED ORDER — METHYLPHENIDATE HCL 10 MG PO TABS: 10.0000 mg | ORAL_TABLET | Freq: Four times a day (QID) | ORAL | 0 refills | Status: DC

## 2017-12-15 NOTE — Telephone Encounter (Signed)
Prescription request

## 2018-01-05 ENCOUNTER — Telehealth: Payer: Self-pay | Admitting: *Deleted

## 2018-01-05 NOTE — Telephone Encounter (Signed)
Faxed completed/signed Tysabri pt status report and reauth questionnaire to [employer] at [phone]. Received confirmation.  

## 2018-01-05 NOTE — Telephone Encounter (Signed)
Received fax notification from [employer] [facility] prescribing program that pt auth is valid from 01/05/18-08/05/18. Enrollment number: [id number]. Site auth number: [id number].

## 2018-02-12 ENCOUNTER — Telehealth: Payer: Self-pay | Admitting: *Deleted

## 2018-02-12 ENCOUNTER — Encounter: Payer: Self-pay | Admitting: Neurology

## 2018-02-12 ENCOUNTER — Ambulatory Visit
Admission: RE | Admit: 2018-02-12 | Discharge: 2018-02-12 | Disposition: A | Discharge status: Home / Self Care | Payer: Medicare Other | Source: Ambulatory Visit | Attending: Neurology | Admitting: Neurology

## 2018-02-12 ENCOUNTER — Ambulatory Visit (INDEPENDENT_AMBULATORY_CARE_PROVIDER_SITE_OTHER): Payer: Medicare Other | Admitting: Neurology

## 2018-02-12 VITALS — BP 132/80 | HR 76 | Wt 142.5 lb

## 2018-02-12 DIAGNOSIS — G35 Multiple sclerosis: Secondary | ICD-10-CM

## 2018-02-12 DIAGNOSIS — N319 Neuromuscular dysfunction of bladder, unspecified: Secondary | ICD-10-CM

## 2018-02-12 DIAGNOSIS — R5383 Other fatigue: Secondary | ICD-10-CM

## 2018-02-12 DIAGNOSIS — M25561 Pain in right knee: Secondary | ICD-10-CM | POA: Diagnosis not present

## 2018-02-12 DIAGNOSIS — K5909 Other constipation: Secondary | ICD-10-CM

## 2018-02-12 DIAGNOSIS — Z79899 Other long term (current) drug therapy: Secondary | ICD-10-CM | POA: Diagnosis not present

## 2018-02-12 DIAGNOSIS — G35D Multiple sclerosis, unspecified: Secondary | ICD-10-CM

## 2018-02-12 DIAGNOSIS — R269 Unspecified abnormalities of gait and mobility: Secondary | ICD-10-CM

## 2018-02-12 MED ORDER — ALPRAZOLAM 0.25 MG PO TABS: ORAL_TABLET | ORAL | 0 refills | Status: DC

## 2018-02-12 NOTE — Telephone Encounter (Signed)
Placed JCV lab in [facility] lock box for routine lab pick up.  

## 2018-02-12 NOTE — Progress Notes (Signed)
[facility]  PATIENT: Kellie [name] DOB: 1960-08-23  REFERRING CLINICIAN: Vernona Rieger HISTORY FROM: Patient  REASON FOR VISIT: Multiple Sclerosis   HISTORICAL  CHIEF COMPLAINT:  Chief Complaint  Patient presents with  . Follow-up    RM 13, alone. Last seen 08/07/17. Denies any vision changes.   . Multiple Sclerosis    On Tysabri. Lsat JCV drawn 08/07/17: negative, titer: 0.15. Needs repeat JCV today. Last infusion 02/09/18. Receives infusion at [facility].  . Knee Pain    c/o right knee pain that has been going on for months. Pt unsure what she did.   . Fall    Has had several falls since last visit. Denies any significant injuries. Denies hitting head.    HISTORY OF PRESENT ILLNESS:  Kellie [name] is a 57 y.o. woman with MS diagnoses in 1997.     Update 12/58/2019: She denies any exacerbation and feels her MS has been mostly stable.  She is on Tysabri as she tolerates it well.  She is trying to remain active and does Jazzercise 5-6 times a week.  Her gait is mildly ataxic and she stumbles with occasional falls.  One fall recently she landed on her right knee and continues to have pain in the right knee.  She notes some increased spasticity in the legs, right more than left.  The right leg is a little weaker.  She continues to have bowel and bladder difficulties.  She does self catheterization.  She sometimes has to manually help evacuate..  Although she has fatigue.  It does not prevent her from doing what she wants to do return has helped this some.  She notes some difficulties with word finding that is also helped by Ritalin.  She sleeps well most nights.  Mood is doing well.  She wishes to know if she can take elderberry syrup.  Update 08/07/2017:   She feels her MS is mostly stable.  She denies any exacerbations.  She tolerates it well.  She has been JCV antibody negative, last tested about 6 months ago.  She would like to get Tysabri a couple days early as  she feels more tired the last few days of every cycle and also notes more mental fog the last few days of every cycle.  MRI of the brain and cervical spine 03/20/2017 did not show new lesions.  She feels her gait and balance are doing the same.    She trips a lot and stumbles but only has some falls.  She fell when stepping over her dog and hit her chest hard.    Bladder frequency and urgency helped by Myrbetriq and oxybutynin.    She needs to catheterize.   She sees Dr. Sherron Monday.   Vision I doing well.   She sees ophthalmology regularly.      She has fatigue, especially the last few She notes mental fog with reduced STM and word finding difficulty.  She has trouble with parallel processing and executive function tasks as well.  Ritalin has helped a little bit but she takes only 20 mg most morning and skips the pm dose.    She has depression that is stable .   She notes some financial stress, especially with her medicare supplement expenses,      Update 02/06/2017:    Her MS continues to do well. She has been on Tysabri since 2008 and she tolerates it well. Last JCV antibody test 08/21/2016 was negative at 0.16.  MRIs of the brain and cervical spine performed in 2016 showed plaques consistent with MS but nothing appeared to be acute. The cervical spine shows at least 4 plaques.  Her gait is doing ok and she does Jazzercise and strength training 5 days a week.   Strength ansd sensation are unchanged.  She fell last week but  only hit hip and no injury.   Years ago, she fractured her right hip with a fall.    Bladder is doing about the same but she still has a lot of constipation.    Vision is doing well  She notes a lot of stress, mostly financial as her supplemental insurance is high and she does not qualify for medicaid.  Depression is stable.  She has some decreased focus and attention..   She notes some fatigue.    Sleep is good with valium.   From 07/29/2016: MS:  She has been on Tysabri since  2008. She tolerates it well and her MS has been mostly stable while she has been on it.   The JCV Ab (11/10/2015) negative at 0.12   She denies any new exacerbations though gait has worsened some in last couple years.   The last MRI of the brain and cervical spine was performed in 2016. They showed no new lesions.  There are at least 4 plaques in the cervical spine.   Gait/strength/sensation:  She fell in March hitting her head and had a mild concussion (no LOC).   Balance is poor and she falls occasionally.    She exercise regularly with Jazzercise.  She has mild weakness and spasticity in her legs.      Her the right leg seems weaker than the left.  No arm weakness but she sometimes drops items.    Left leg has reduced ROM form fracture in past.     She continues to have hand and leg tingling, helped by gabapentin.  The addition of lamotrigine has helped more.   She tolerates it well.    Gait issues are worse when she is tired or when hot.      Bladder/bowel:   She has had more UTI's.   She was recently switched from nitrofurantoin to Cipro.   She sees Dr. Perley Jain.   She continues to have problems with urinary urgency and frequency.  She does intermittent catheterization every few hous. She is currently on oxybutynin combined with Myrbetriq bid with benefit.    She has severe constipation but Linzess caused constipation.    With the desmopressin she doesn't have to wake up to catheterize herself or because of incontinence.  Fatigue:   She has fatigue, about the same as last visit, but she remains active. She does Jazzercise 5 days a week over 8 different classes. She feels better when she does her exercises.  Mood:   She has had depression in the past but is generally feeling good currently.  Her son was recently injured doing a Building control surveyor.   She has financial worries.   She has some anxiety also.    Effexor helps some and is well tolerated  Cognition:   She has mild cognitive issues and has some verbal  fluency issues.  She has mild short term memory issues and feels focus/atention is decreased.  Ritalin helps attention and focus some.   She feels these issues have been stable for the most part.  Pain:    The right leg is still painful intermittently.   She  sees Dr. Dewaine Oats (Ortho)  A trochanteric bursa injection last year only helped a couple days.      Sleep:   She has insomnia that is better with Valium and desmopressin.   She falls asleep easily and stays asleep most of the night.      MS History:  She presented with left sided numbness in September 1997.   She underwent an MRI and a lumbar puncture and was told MS was unlikely.   She had right sided numbness in January 1998 and had brain and spine MRI.    There were changes suggestive of MS and she was diagnosed in January 1998.   She was started on Betaseron.   She was on Betaseron x 10 years and then switched to Avonex for a year due to tolerability issues.   She had clinical exacerbations and MRI changes and then went on Tysabri in 2007 or 2008 and has had 92 infusions, her last infusion being 03/02/15.    She is transferring care from [facility] to [facility].  She tolerates Tysabri well.   She is JCV Ab negative.     An MRI of the spinal cord dated 02/02/2013 shows demyelinating plaque to the left adjacent to C3, anteriorly adjacent to C4, to the right adjacent to C5-C6, posteriorly to the right adjacent to C6-C7. When that study was compared to an MRI dated 12/29/2006, there was no definite interval change. MRI of the brain the same day was abnormal showing signal changes compatible with the diagnosis of multiple sclerosis. Compared to the prior study dated 02/23/2012, there was no definite interval change.      MRI brain and cervical spine (09/2014) showing 4 cervical spine foci and typical demyelination plaques in brain (brain unchanged compared to 2012)..    She had MRI 05/2016 after a fall.       REVIEW OF SYSTEMS:  Constitutional: No  fevers, chills, sweats, or change in appetite.   Notes fatigue and some insomnia Eyes: No visual changes, double vision, eye pain Ear, nose and throat: No hearing loss, ear pain, nasal congestion, sore throat Cardiovascular: No chest pain, palpitations Respiratory:  No shortness of breath at rest or with exertion.   No wheezes GastrointestinaI: No nausea, vomiting, diarrhea, abdominal pain.   She has dysphagia Genitourinary: as above. Musculoskeletal:  as above iiIntegumentary: No rash, pruritus, skin lesions Neurological: as above Psychiatric: Mild depression at this time.  Mild anxiety Endocrine: No palpitations, diaphoresis, change in appetite, change in weigh or increased thirst Hematologic/Lymphatic:  No anemia, purpura, petechiae. Allergic/Immunologic: No itchy/runny eyes, nasal congestion, recent allergic reactions, rashes  ALLERGIES: Allergies  Allergen Reactions  . Erythromycin Nausea And Vomiting  . Clindamycin/Lincomycin   . Dilaudid [Hydromorphone Hcl] Other (See Comments)    Hallucinations  . Sulfa Antibiotics     HOME MEDICATIONS:  Current Outpatient Medications:  .  ALPRAZolam (XANAX) 0.25 MG tablet, TAKE 1 TABLET BY MOUTH qHS AS NEEDED FOR ANXIETY, Disp: 90 tablet, Rfl: 0 .  Calcium Carbonate-Vitamin D 600-125 MG-UNIT TABS, Take by mouth., Disp: , Rfl:  .  cetirizine (ZYRTEC) 1 MG/ML syrup, Take 5 mg by mouth., Disp: , Rfl:  .  ciprofloxacin (CIPRO) 250 MG tablet, Take 1 tablet by mouth daily as needed., Disp: , Rfl:  .  desmopressin (DDAVP) 0.1 MG tablet, Take 1 tablet (100 mcg total) by mouth daily., Disp: 90 tablet, Rfl: 3 .  diazepam (VALIUM) 5 MG tablet, TAKE ONE TABLET BY MOUTH TWO TIMES DAILY  AS NEEDED, Disp: 180 tablet, Rfl: 1 .  famciclovir (FAMVIR) 250 MG tablet, TAKE 1 TABLET BY MOUTH TWICE A DAY, Disp: , Rfl:  .  Fish Oil-Cholecalciferol (FISH OIL + D3) 1000-1000 MG-UNIT CAPS, Take by mouth., Disp: , Rfl:  .  gabapentin (NEURONTIN) 600 MG tablet, Take 2  tablets 4 times daily, Disp: 720 tablet, Rfl: 3 .  Ginger, Zingiber officinalis, (GINGER ROOT) 550 MG CAPS, Take by mouth., Disp: , Rfl:  .  lamoTRIgine (LAMICTAL) 100 MG tablet, TAKE 1 TABLET BY MOUTH TWICE A DAY, Disp: 180 tablet, Rfl: 3 .  methylphenidate (RITALIN) 10 MG tablet, Take 1 tablet (10 mg total) by mouth 4 (four) times daily., Disp: 360 tablet, Rfl: 0 .  MYRBETRIQ 50 MG TB24 tablet, TAKE 1 TABLET ONCE DAILY., Disp: 30 tablet, Rfl: 6 .  natalizumab 300 mg in sodium chloride 0.9 % 100 mL, Inject into the vein., Disp: , Rfl:  .  nitrofurantoin (MACRODANTIN) 100 MG capsule, Take 1 capsule (100 mg total) by mouth at bedtime., Disp: 90 capsule, Rfl: 0 .  omeprazole (PRILOSEC) 20 MG capsule, 2 (two) times daily., Disp: , Rfl:  .  oxybutynin (DITROPAN-XL) 10 MG 24 hr tablet, Take 2 tablets (20 mg total) by mouth daily. As directed, Disp: 180 tablet, Rfl: 1 .  simvastatin (ZOCOR) 40 MG tablet, TAKE 1 TABLET AT BEDTIME FOR CHOLESTEROL, Disp: , Rfl:  .  venlafaxine XR (EFFEXOR-XR) 150 MG 24 hr capsule, TAKE 2 CAPSULES (300 MG TOTAL) BY MOUTH DAILY., Disp: 180 capsule, Rfl: 3 .  vitamin B-12 (CYANOCOBALAMIN) 1000 MCG tablet, Take 1,000 mcg by mouth daily., Disp: , Rfl:    PAST MEDICAL HISTORY: Past Medical History:  Diagnosis Date  . Chronic kidney disease   . Complication of anesthesia    pt states she got thrush after anesthesia  . Multiple sclerosis (HCC)   . Neuropathy   . Vision abnormalities     PAST SURGICAL HISTORY: Past Surgical History:  Procedure Laterality Date  . CARPAL TUNNEL RELEASE Right    2010  . CARPAL TUNNEL RELEASE Left   . CESAREAN SECTION    . COLONOSCOPY  07/26/14  . ENDOMETRIAL ABLATION     2007  . HIP FRACTURE SURGERY    . hysterctomy    . TUBAL LIGATION      FAMILY HISTORY: Family History  Problem Relation Age of Onset  . Lung cancer Mother   . Dementia Father   . Diverticulitis Father   . Pancreatitis Father   . Breast cancer Cousin      SOCIAL HISTORY:  Social History   Socioeconomic History  . Marital status: Legally Separated    Spouse name: Not on file  . Number of children: Not on file  . Years of education: Not on file  . Highest education level: Not on file  Occupational History  . Not on file  Social Needs  . Financial resource strain: Not on file  . Food insecurity:    Worry: Not on file    Inability: Not on file  . Transportation needs:    Medical: Not on file    Non-medical: Not on file  Tobacco Use  . Smoking status: Former Smoker    Last attempt to quit: 03/29/1981    Years since quitting: 36.9  . Smokeless tobacco: Never Used  Substance and Sexual Activity  . Alcohol use: Yes    Alcohol/week: 0.0 standard drinks    Comment: Occasional  . Drug use:  No  . Sexual activity: Not on file  Lifestyle  . Physical activity:    Days per week: Not on file    Minutes per session: Not on file  . Stress: Not on file  Relationships  . Social connections:    Talks on phone: Not on file    Gets together: Not on file    Attends religious service: Not on file    Active member of club or organization: Not on file    Attends meetings of clubs or organizations: Not on file    Relationship status: Not on file  . Intimate partner violence:    Fear of current or ex partner: Not on file    Emotionally abused: Not on file    Physically abused: Not on file    Forced sexual activity: Not on file  Other Topics Concern  . Not on file  Social History Narrative  . Not on file     PHYSICAL EXAM  Vitals:   02/12/18 1142  BP: 132/80  Pulse: 76  SpO2: 96%  Weight: 142 lb 8 oz (64.6 kg)    Body mass index is 25.24 kg/m.   General: The patient is well-developed and well-nourished and in no acute distress   Neurologic Exam  Mental status: The patient is alert and oriented x 3 at the time of the examination. The patient has apparent normal recent and remote memory, with an apparently normal  attention span and concentration ability.   Speech is normal.  Cranial nerves: Extraocular movements are full.  Facial strength and sensation is normal.  The trapezius strength is normal.  The tongue is midline, and the patient has symmetric elevation of the soft palate. No obvious hearing deficits are noted.  Motor:  Muscle bulk is normal.    Muscle tone is increased in the legs, right greater than left.  Strength is 5/5.  Sensory: Sensory testing is intact to touch and vibration.  Coordination: Cerebellar testing shows good finger-nose-finger but mildly reduced heel-to-shin.  Gait and station: Station is normal.   The gait is mildly wide and the tandem gait is moderately wide.  Romberg is negative.  Reflexes: Deep tendon reflexes are increased bilaterally in legs.  There is spread of the reflexes at the knees.. Plantar responses are normal.    DIAGNOSTIC DATA (LABS, IMAGING, TESTING) - I reviewed patient records, labs, notes, testing and imaging myself where available.  Lab Results  Component Value Date   WBC 7.8 08/07/2017   HGB 12.0 08/07/2017   HCT 35.1 08/07/2017   MCV 90 08/07/2017   PLT 183 08/07/2017      Component Value Date/Time   PROT 6.7 09/29/2007 0837   ALBUMIN 4.1 09/29/2007 0837   AST 34 09/29/2007 0837   ALT 30 09/29/2007 0837   ALKPHOS 88 09/29/2007 0837   BILITOT 1.1 09/29/2007 0837   Lab Results  Component Value Date   CHOL 136 09/29/2007   HDL 41.5 09/29/2007   LDLCALC 69 09/29/2007   TRIG 126 09/29/2007   CHOLHDL 3.3 CALC 09/29/2007       ASSESSMENT AND PLAN  Multiple sclerosis (HCC) - Plan: CBC with Differential/Platelet, Stratify JCV Antibody Test ([facility])  Right knee pain, unspecified chronicity - Plan: DG Knee 1-2 Views Right  High risk medication use  Other fatigue  Other constipation  Abnormality of gait  Bladder neurogenesis   1.   She will continue Tysabri.  We will check the JCV antibody and CBC today.  She asked about  more frequent dosing but we cannot go with a shorter interval to 28 days.   2.   She will continue her medications.  Continue methylphenidate.  She can take up to 4 pills a day though she usually takes just 1 or 2.   3.  Refill Xanax 4.  Stay active and exercises tolerated. 5.  Check x-ray of the right knee due to continued pain. 6.  she will return to see me in 6 months, sooner if she has new or worsening neurologic symptoms.     A. Epimenio Foot, MD, PhD 02/12/2018, 2:59 PM Certified in Neurology, Clinical Neurophysiology, Sleep Medicine, Pain Medicine and Neuroimaging  [facility] [address] [place], [place] [address] [phone]

## 2018-02-13 ENCOUNTER — Telehealth: Payer: Self-pay | Admitting: *Deleted

## 2018-02-13 LAB — CBC WITH DIFFERENTIAL/PLATELET
Basophils Absolute: 0.1 10*3/uL (ref 0.0–0.2)
Basos: 1 %
EOS (ABSOLUTE): 0.4 10*3/uL (ref 0.0–0.4)
EOS: 5 %
HEMATOCRIT: 34.4 % (ref 34.0–46.6)
HEMOGLOBIN: 12 g/dL (ref 11.1–15.9)
IMMATURE GRANULOCYTES: 1 %
Immature Grans (Abs): 0 10*3/uL (ref 0.0–0.1)
LYMPHS ABS: 3.5 10*3/uL — AB (ref 0.7–3.1)
Lymphs: 42 %
MCH: 31.7 pg (ref 26.6–33.0)
MCHC: 34.9 g/dL (ref 31.5–35.7)
MCV: 91 fL (ref 79–97)
MONOS ABS: 0.6 10*3/uL (ref 0.1–0.9)
Monocytes: 7 %
NEUTROS PCT: 44 %
NRBC: 1 % — ABNORMAL HIGH (ref 0–0)
Neutrophils Absolute: 3.8 10*3/uL (ref 1.4–7.0)
Platelets: 180 10*3/uL (ref 150–450)
RBC: 3.79 x10E6/uL (ref 3.77–5.28)
RDW: 14 % (ref 12.3–15.4)
WBC: 8.4 10*3/uL (ref 3.4–10.8)

## 2018-02-13 NOTE — Telephone Encounter (Signed)
Called and LVM for pt about normal xray of her knee per Dr. Epimenio Foot. Gave [facility] phone number if she has further questions.

## 2018-02-13 NOTE — Telephone Encounter (Signed)
-----   Message from Asa Lente, MD sent at 02/12/2018  7:03 PM EST ----- Please let the patient know that the knee x-ray is normal.

## 2018-02-18 ENCOUNTER — Telehealth: Payer: Self-pay | Admitting: *Deleted

## 2018-02-18 NOTE — Telephone Encounter (Signed)
JCV ab collected on 02/12/18 is Negative 0.13/fim

## 2018-03-09 ENCOUNTER — Telehealth: Payer: Self-pay | Admitting: Neurology

## 2018-03-09 NOTE — Telephone Encounter (Signed)
Transferred pt to infusions  °

## 2018-06-29 ENCOUNTER — Other Ambulatory Visit: Payer: Self-pay | Admitting: *Deleted

## 2018-06-29 MED ORDER — METHYLPHENIDATE HCL 10 MG PO TABS: 10.0000 mg | ORAL_TABLET | Freq: Four times a day (QID) | ORAL | 0 refills | Status: DC

## 2018-06-29 NOTE — Telephone Encounter (Signed)
Pt here today for Tysabri infusion. She requested refill for Ritalin sent to [facility] in [place], [place]. I checked drug registry. She last refilled rx 12/19/17 #360. Last seen 02/12/18. Had no f/u scheduled. I spoke with pt and scheduled her 6 month f/u for 08/20/18 at 1:00pm. I gave appt card to pt as reminder.

## 2018-07-12 ENCOUNTER — Other Ambulatory Visit: Payer: Self-pay | Admitting: Neurology

## 2018-07-14 ENCOUNTER — Telehealth: Payer: Self-pay | Admitting: *Deleted

## 2018-07-14 NOTE — Telephone Encounter (Signed)
Late entry from 07/13/18: Faxed completed/signed Tysabri pt status report and reauth questionnaire to [place] [facility] at [phone]. Received confirmation.   Received fax notification back from touch prescribing program that pt re-authorized 07/13/18-02/04/19. Patient enrollment #:[id number]. Account: [facility]. Site auth#: [id number].

## 2018-07-28 ENCOUNTER — Other Ambulatory Visit: Payer: Self-pay | Admitting: Neurology

## 2018-08-18 ENCOUNTER — Telehealth: Payer: Self-pay | Admitting: Neurology

## 2018-08-18 ENCOUNTER — Other Ambulatory Visit: Payer: Self-pay | Admitting: *Deleted

## 2018-08-18 DIAGNOSIS — G35 Multiple sclerosis: Secondary | ICD-10-CM

## 2018-08-18 DIAGNOSIS — Z79899 Other long term (current) drug therapy: Secondary | ICD-10-CM

## 2018-08-18 NOTE — Telephone Encounter (Signed)
Called pt. Updated med list, pharmacy, allergies on file for VV 08/20/18. Her next Tysabri infusion is 08/24/18. She is aware she needs to get labs that day when she is here. Placed future lab orders.

## 2018-08-18 NOTE — Telephone Encounter (Signed)
Pt has consented to a Virtual Visit. Link sent to [email or web address]  Pt understands that although there may be some limitations with this type of visit, we will take all precautions to reduce any security or privacy concerns.  Pt understands that this will be treated like an in office visit and we will file with pt's insurance, and there may be a patient responsible charge related to this service.

## 2018-08-20 ENCOUNTER — Ambulatory Visit (INDEPENDENT_AMBULATORY_CARE_PROVIDER_SITE_OTHER): Payer: Medicare Other | Admitting: Neurology

## 2018-08-20 ENCOUNTER — Other Ambulatory Visit: Payer: Self-pay

## 2018-08-20 ENCOUNTER — Encounter: Payer: Self-pay | Admitting: Neurology

## 2018-08-20 DIAGNOSIS — R200 Anesthesia of skin: Secondary | ICD-10-CM

## 2018-08-20 DIAGNOSIS — G35 Multiple sclerosis: Secondary | ICD-10-CM | POA: Diagnosis not present

## 2018-08-20 DIAGNOSIS — Z79899 Other long term (current) drug therapy: Secondary | ICD-10-CM

## 2018-08-20 DIAGNOSIS — G4700 Insomnia, unspecified: Secondary | ICD-10-CM

## 2018-08-20 DIAGNOSIS — R269 Unspecified abnormalities of gait and mobility: Secondary | ICD-10-CM

## 2018-08-20 DIAGNOSIS — Z87448 Personal history of other diseases of urinary system: Secondary | ICD-10-CM

## 2018-08-20 DIAGNOSIS — Z87718 Personal history of other specified (corrected) congenital malformations of genitourinary system: Secondary | ICD-10-CM

## 2018-08-20 MED ORDER — ALPRAZOLAM 0.25 MG PO TABS: ORAL_TABLET | ORAL | 0 refills | Status: DC

## 2018-08-20 NOTE — Progress Notes (Signed)
[facility]  PATIENT: Kellie [name] DOB: [birth year]  REFERRING CLINICIAN: Vernona Rieger HISTORY FROM: Patient  REASON FOR VISIT: Multiple Sclerosis   HISTORICAL  CHIEF COMPLAINT:  Chief Complaint  Patient presents with   Multiple Sclerosis    HISTORY OF PRESENT ILLNESS:  Kellie [name] is a 58 y.o. woman with MS diagnoses in 1997.   Update 08/20/2018:   Virtual Visit via Video Note I connected with Kellie [name] on 08/20/18 at  1:00 PM EDT by a video enabled telemedicine application and verified that I am speaking with the correct person.  I discussed the limitations of evaluation and management by telemedicine and the availability of in person appointments. The patient expressed understanding and agreed to proceed.  History of Present Illness: She is on Tysabri and tolerates it well.   No recent exacerbation. She is JCV Ab negative (0.13).    Her gait is about the same.  She denies any recent falls.  She has some weakness, right > left in the legs.    She notes numbness and tingling.  Gabapentin and lamotrigine have helped some.  She moves her legs a lot as staying still worsens the dysesthesia.  She has urinary urgency and is better with oxybutynin and night time desmopressin.   She remains active and does Jazzercise and other exercise practically every day.      She has fatigue.  Ritalin has helped.   She sleeps well with night time valium.  She has some anxiety and takes xanax as needed.   Observations/Objective:  She is a well-developed well-nourished woman in no acute distress.  The head is normocephalic and atraumatic.  Sclera are anicteric.  Visible skin appears normal.  The neck has a good range of motion.  Pharynx and tongue have normal appearance.  She is alert and fully oriented with fluent speech and good attention, knowledge and memory.  Extraocular muscles are intact.  Facial strength is normal.  Palatal elevation and tongue protrusion are midline.  She  appears to have normal strength in the arms.  Rapid alternating movements and finger-nose-finger are performed well.  Assessment and Plan: 1. Multiple sclerosis (HCC)   2. High risk medication use   3. History of urinary anomaly   4. Abnormality of gait   5. Numbness   6. Insomnia, unspecified type     1.  Continue Tysabri.   We will check a JCV antibody and CBC with differential when she returns for her next infusion. 2.  Stay active and exercise as tolerated. 3.  Continue current medications and refill medications as needed 4.   Return to see me in 6 months or sooner if there are new or worsening neurologic symptoms.  Follow Up Instructions: I discussed the assessment and treatment plan with the patient. The patient was provided an opportunity to ask questions and all were answered. The patient agreed with the plan and demonstrated an understanding of the instructions.    The patient was advised to call back or seek an in-person evaluation if the symptoms worsen or if the condition fails to improve as anticipated.  I provided 30 minutes of non-face-to-face time during this encounter.  _____________________________________  Update 02/12/2018: She denies any exacerbation and feels her MS has been mostly stable.  She is on Tysabri as she tolerates it well.  She is trying to remain active and does Jazzercise 5-6 times a week.  Her gait is mildly ataxic and she stumbles with occasional falls.  One  fall recently she landed on her right knee and continues to have pain in the right knee.  She notes some increased spasticity in the legs, right more than left.  The right leg is a little weaker.  She continues to have bowel and bladder difficulties.  She does self catheterization.  She sometimes has to manually help evacuate..  Although she has fatigue.  It does not prevent her from doing what she wants to do return has helped this some.  She notes some difficulties with word finding that is also helped  by Ritalin.  She sleeps well most nights.  Mood is doing well.  She wishes to know if she can take elderberry syrup.  Update 08/07/2017:   She feels her MS is mostly stable.  She denies any exacerbations.  She tolerates it well.  She has been JCV antibody negative, last tested about 6 months ago.  She would like to get Tysabri a couple days early as she feels more tired the last few days of every cycle and also notes more mental fog the last few days of every cycle.  MRI of the brain and cervical spine 03/20/2017 did not show new lesions.  She feels her gait and balance are doing the same.    She trips a lot and stumbles but only has some falls.  She fell when stepping over her dog and hit her chest hard.    Bladder frequency and urgency helped by Myrbetriq and oxybutynin.    She needs to catheterize.   She sees Dr. Sherron MondayMacdiarmid.   Vision I doing well.   She sees ophthalmology regularly.      She has fatigue, especially the last few She notes mental fog with reduced STM and word finding difficulty.  She has trouble with parallel processing and executive function tasks as well.  Ritalin has helped a little bit but she takes only 20 mg most morning and skips the pm dose.    She has depression that is stable .   She notes some financial stress, especially with her medicare supplement expenses,      Update 02/06/2017:    Her MS continues to do well. She has been on Tysabri since 2008 and she tolerates it well. Last JCV antibody test 08/21/2016 was negative at 0.16.     MRIs of the brain and cervical spine performed in 2016 showed plaques consistent with MS but nothing appeared to be acute. The cervical spine shows at least 4 plaques.  Her gait is doing ok and she does Jazzercise and strength training 5 days a week.   Strength ansd sensation are unchanged.  She fell last week but  only hit hip and no injury.   Years ago, she fractured her right hip with a fall.    Bladder is doing about the same but she still has  a lot of constipation.    Vision is doing well  She notes a lot of stress, mostly financial as her supplemental insurance is high and she does not qualify for medicaid.  Depression is stable.  She has some decreased focus and attention..   She notes some fatigue.    Sleep is good with valium.   From 07/29/2016: MS:  She has been on Tysabri since 2008. She tolerates it well and her MS has been mostly stable while she has been on it.   The JCV Ab (11/10/2015) negative at 0.12   She denies any new exacerbations though gait has  worsened some in last couple years.   The last MRI of the brain and cervical spine was performed in 2016. They showed no new lesions.  There are at least 4 plaques in the cervical spine.   Gait/strength/sensation:  She fell in March hitting her head and had a mild concussion (no LOC).   Balance is poor and she falls occasionally.    She exercise regularly with Jazzercise.  She has mild weakness and spasticity in her legs.      Her the right leg seems weaker than the left.  No arm weakness but she sometimes drops items.    Left leg has reduced ROM form fracture in past.     She continues to have hand and leg tingling, helped by gabapentin.  The addition of lamotrigine has helped more.   She tolerates it well.    Gait issues are worse when she is tired or when hot.      Bladder/bowel:   She has had more UTI's.   She was recently switched from nitrofurantoin to Cipro.   She sees Dr. Perley Jain.   She continues to have problems with urinary urgency and frequency.  She does intermittent catheterization every few hous. She is currently on oxybutynin combined with Myrbetriq bid with benefit.    She has severe constipation but Linzess caused constipation.    With the desmopressin she doesn't have to wake up to catheterize herself or because of incontinence.  Fatigue:   She has fatigue, about the same as last visit, but she remains active. She does Jazzercise 5 days a week over 8 different  classes. She feels better when she does her exercises.  Mood:   She has had depression in the past but is generally feeling good currently.  Her son was recently injured doing a Building control surveyor.   She has financial worries.   She has some anxiety also.    Effexor helps some and is well tolerated  Cognition:   She has mild cognitive issues and has some verbal fluency issues.  She has mild short term memory issues and feels focus/atention is decreased.  Ritalin helps attention and focus some.   She feels these issues have been stable for the most part.  Pain:    The right leg is still painful intermittently.   She sees Dr. Dewaine Oats (Ortho)  A trochanteric bursa injection last year only helped a couple days.      Sleep:   She has insomnia that is better with Valium and desmopressin.   She falls asleep easily and stays asleep most of the night.      MS History:  She presented with left sided numbness in September 1997.   She underwent an MRI and a lumbar puncture and was told MS was unlikely.   She had right sided numbness in January 1998 and had brain and spine MRI.    There were changes suggestive of MS and she was diagnosed in January 1998.   She was started on Betaseron.   She was on Betaseron x 10 years and then switched to Avonex for a year due to tolerability issues.   She had clinical exacerbations and MRI changes and then went on Tysabri in 2007 or 2008 and has had 92 infusions, her last infusion being 03/02/15.    She is transferring care from [facility] to [facility].  She tolerates Tysabri well.   She is JCV Ab negative.     An MRI of the  spinal cord dated 02/02/2013 shows demyelinating plaque to the left adjacent to C3, anteriorly adjacent to C4, to the right adjacent to C5-C6, posteriorly to the right adjacent to C6-C7. When that study was compared to an MRI dated 12/29/2006, there was no definite interval change. MRI of the brain the same day was abnormal showing signal changes compatible with  the diagnosis of multiple sclerosis. Compared to the prior study dated 02/23/2012, there was no definite interval change.      MRI brain and cervical spine (09/2014) showing 4 cervical spine foci and typical demyelination plaques in brain (brain unchanged compared to 2012)..    She had MRI 05/2016 after a fall.       REVIEW OF SYSTEMS:  Constitutional: No fevers, chills, sweats, or change in appetite.   Notes fatigue and some insomnia Eyes: No visual changes, double vision, eye pain Ear, nose and throat: No hearing loss, ear pain, nasal congestion, sore throat Cardiovascular: No chest pain, palpitations Respiratory:  No shortness of breath at rest or with exertion.   No wheezes GastrointestinaI: No nausea, vomiting, diarrhea, abdominal pain.   She has dysphagia Genitourinary: as above. Musculoskeletal:  as above iiIntegumentary: No rash, pruritus, skin lesions Neurological: as above Psychiatric: Mild depression at this time.  Mild anxiety Endocrine: No palpitations, diaphoresis, change in appetite, change in weigh or increased thirst Hematologic/Lymphatic:  No anemia, purpura, petechiae. Allergic/Immunologic: No itchy/runny eyes, nasal congestion, recent allergic reactions, rashes  ALLERGIES: Allergies  Allergen Reactions   Erythromycin Nausea And Vomiting   Clindamycin/Lincomycin    Dilaudid [Hydromorphone Hcl] Other (See Comments)    Hallucinations   Sulfa Antibiotics     HOME MEDICATIONS:  Current Outpatient Medications:    ALPRAZolam (XANAX) 0.25 MG tablet, TAKE 1 TABLET BY MOUTH qHS AS NEEDED FOR ANXIETY, Disp: 90 tablet, Rfl: 0   Calcium Carbonate-Vitamin D 600-125 MG-UNIT TABS, Take by mouth., Disp: , Rfl:    cetirizine (ZYRTEC) 1 MG/ML syrup, Take 5 mg by mouth., Disp: , Rfl:    ciprofloxacin (CIPRO) 250 MG tablet, Take 1 tablet by mouth daily as needed., Disp: , Rfl:    desmopressin (DDAVP) 0.1 MG tablet, TAKE 1 TABLET BY MOUTH EVERY DAY, Disp: 90 tablet, Rfl:  3   diazepam (VALIUM) 5 MG tablet, TAKE ONE TABLET BY MOUTH TWO TIMES DAILY AS NEEDED, Disp: 180 tablet, Rfl: 1   famciclovir (FAMVIR) 250 MG tablet, TAKE 1 TABLET BY MOUTH TWICE A DAY, Disp: , Rfl:    Fish Oil-Cholecalciferol (FISH OIL + D3) 1000-1000 MG-UNIT CAPS, Take by mouth., Disp: , Rfl:    gabapentin (NEURONTIN) 600 MG tablet, Take 2 tablets 4 times daily, Disp: 720 tablet, Rfl: 3   Ginger, Zingiber officinalis, (GINGER ROOT) 550 MG CAPS, Take by mouth., Disp: , Rfl:    lamoTRIgine (LAMICTAL) 100 MG tablet, TAKE 1 TABLET BY MOUTH TWICE A DAY, Disp: 180 tablet, Rfl: 3   methylphenidate (RITALIN) 10 MG tablet, Take 1 tablet (10 mg total) by mouth 4 (four) times daily., Disp: 360 tablet, Rfl: 0   MYRBETRIQ 50 MG TB24 tablet, TAKE 1 TABLET ONCE DAILY., Disp: 30 tablet, Rfl: 6   natalizumab 300 mg in sodium chloride 0.9 % 100 mL, Inject into the vein., Disp: , Rfl:    nitrofurantoin (MACRODANTIN) 100 MG capsule, Take 1 capsule (100 mg total) by mouth at bedtime., Disp: 90 capsule, Rfl: 0   omeprazole (PRILOSEC) 20 MG capsule, 2 (two) times daily., Disp: , Rfl:    oxybutynin (DITROPAN-XL)  10 MG 24 hr tablet, Take 2 tablets (20 mg total) by mouth daily. As directed, Disp: 180 tablet, Rfl: 1   simvastatin (ZOCOR) 40 MG tablet, TAKE 1 TABLET AT BEDTIME FOR CHOLESTEROL, Disp: , Rfl:    venlafaxine XR (EFFEXOR-XR) 150 MG 24 hr capsule, TAKE 2 CAPSULES (300 MG TOTAL) BY MOUTH DAILY., Disp: 180 capsule, Rfl: 3   vitamin B-12 (CYANOCOBALAMIN) 1000 MCG tablet, Take 1,000 mcg by mouth daily., Disp: , Rfl:    PAST MEDICAL HISTORY: Past Medical History:  Diagnosis Date   Chronic kidney disease    Complication of anesthesia    pt states she got thrush after anesthesia   Multiple sclerosis ([place])    Neuropathy    Vision abnormalities     PAST SURGICAL HISTORY: Past Surgical History:  Procedure Laterality Date   CARPAL TUNNEL RELEASE Right    2010   CARPAL TUNNEL RELEASE  Left    CESAREAN SECTION     COLONOSCOPY  07/26/14   ENDOMETRIAL ABLATION     2007   HIP FRACTURE SURGERY     hysterctomy     TUBAL LIGATION      FAMILY HISTORY: Family History  Problem Relation Age of Onset   Lung cancer Mother    Dementia Father    Diverticulitis Father    Pancreatitis Father    Breast cancer Cousin     SOCIAL HISTORY:  Social History   Socioeconomic History   Marital status: Legally Separated    Spouse name: Not on file   Number of children: Not on file   Years of education: Not on file   Highest education level: Not on file  Occupational History   Not on file  Social Needs   Financial resource strain: Not on file   Food insecurity    Worry: Not on file    Inability: Not on file   Transportation needs    Medical: Not on file    Non-medical: Not on file  Tobacco Use   Smoking status: Former Smoker    Quit date: 03/29/1981    Years since quitting: 37.4   Smokeless tobacco: Never Used  Substance and Sexual Activity   Alcohol use: Yes    Alcohol/week: 0.0 standard drinks    Comment: Occasional   Drug use: No   Sexual activity: Not on file  Lifestyle   Physical activity    Days per week: Not on file    Minutes per session: Not on file   Stress: Not on file  Relationships   Social connections    Talks on phone: Not on file    Gets together: Not on file    Attends religious service: Not on file    Active member of club or organization: Not on file    Attends meetings of clubs or organizations: Not on file    Relationship status: Not on file   Intimate partner violence    Fear of current or ex partner: Not on file    Emotionally abused: Not on file    Physically abused: Not on file    Forced sexual activity: Not on file  Other Topics Concern   Not on file  Social History Narrative   Not on file     PHYSICAL EXAM  There were no vitals filed for this visit.  There is no height or weight on file to  calculate BMI.   General: The patient is well-developed and well-nourished and in no acute distress  Neurologic Exam  Mental status: The patient is alert and oriented x 3 at the time of the examination. The patient has apparent normal recent and remote memory, with an apparently normal attention span and concentration ability.   Speech is normal.  Cranial nerves: Extraocular movements are full.  Facial strength and sensation is normal.  The trapezius strength is normal.  The tongue is midline, and the patient has symmetric elevation of the soft palate. No obvious hearing deficits are noted.  Motor:  Muscle bulk is normal.    Muscle tone is increased in the legs, right greater than left.  Strength is 5/5.  Sensory: Sensory testing is intact to touch and vibration.  Coordination: Cerebellar testing shows good finger-nose-finger but mildly reduced heel-to-shin.  Gait and station: Station is normal.   The gait is mildly wide and the tandem gait is moderately wide.  Romberg is negative.  Reflexes: Deep tendon reflexes are increased bilaterally in legs.  There is spread of the reflexes at the knees.. Plantar responses are normal.     Donavyn Fecher A. Epimenio Foot, MD, PhD 08/20/2018, 5:46 PM Certified in Neurology, Clinical Neurophysiology, Sleep Medicine, Pain Medicine and Neuroimaging  [facility] [address] [place], [place] [address] [phone]

## 2018-09-18 ENCOUNTER — Other Ambulatory Visit: Payer: Self-pay | Admitting: Internal Medicine

## 2018-09-18 DIAGNOSIS — M810 Age-related osteoporosis without current pathological fracture: Secondary | ICD-10-CM

## 2018-09-21 ENCOUNTER — Telehealth: Payer: Self-pay | Admitting: *Deleted

## 2018-09-21 ENCOUNTER — Other Ambulatory Visit: Payer: Self-pay | Admitting: *Deleted

## 2018-09-21 ENCOUNTER — Encounter: Payer: Self-pay | Admitting: Neurology

## 2018-09-21 ENCOUNTER — Other Ambulatory Visit: Payer: Self-pay

## 2018-09-21 DIAGNOSIS — G35 Multiple sclerosis: Secondary | ICD-10-CM

## 2018-09-21 DIAGNOSIS — Z79899 Other long term (current) drug therapy: Secondary | ICD-10-CM

## 2018-09-21 MED ORDER — DIAZEPAM 5 MG PO TABS: ORAL_TABLET | ORAL | 1 refills | Status: DC

## 2018-09-21 NOTE — Telephone Encounter (Signed)
Placed JCV lab in [facility] lock box for routine lab pick up. Results pending. 

## 2018-09-22 LAB — CBC WITH DIFFERENTIAL/PLATELET
Basophils Absolute: 0.1 10*3/uL (ref 0.0–0.2)
Basos: 1 %
EOS (ABSOLUTE): 0.4 10*3/uL (ref 0.0–0.4)
Eos: 6 %
Hematocrit: 39.2 % (ref 34.0–46.6)
Hemoglobin: 13.1 g/dL (ref 11.1–15.9)
Immature Grans (Abs): 0.1 10*3/uL (ref 0.0–0.1)
Immature Granulocytes: 1 %
Lymphocytes Absolute: 2.7 10*3/uL (ref 0.7–3.1)
Lymphs: 37 %
MCH: 30.7 pg (ref 26.6–33.0)
MCHC: 33.4 g/dL (ref 31.5–35.7)
MCV: 92 fL (ref 79–97)
Monocytes Absolute: 0.6 10*3/uL (ref 0.1–0.9)
Monocytes: 8 %
NRBC: 1 % — ABNORMAL HIGH (ref 0–0)
Neutrophils Absolute: 3.5 10*3/uL (ref 1.4–7.0)
Neutrophils: 47 %
Platelets: 190 10*3/uL (ref 150–450)
RBC: 4.27 x10E6/uL (ref 3.77–5.28)
RDW: 14.2 % (ref 11.7–15.4)
WBC: 7.4 10*3/uL (ref 3.4–10.8)

## 2018-09-28 NOTE — Telephone Encounter (Signed)
JCV drawn on 09/21/18 negative, index: 0.13.

## 2018-09-30 ENCOUNTER — Other Ambulatory Visit: Payer: Self-pay | Admitting: Internal Medicine

## 2018-09-30 DIAGNOSIS — Z1231 Encounter for screening mammogram for malignant neoplasm of breast: Secondary | ICD-10-CM

## 2018-09-30 DIAGNOSIS — M810 Age-related osteoporosis without current pathological fracture: Secondary | ICD-10-CM

## 2018-10-15 ENCOUNTER — Telehealth: Payer: Self-pay | Admitting: Neurology

## 2018-10-15 NOTE — Telephone Encounter (Signed)
I called pt back. She got up today and did her regular routine. She exercised, took dog for walk and fed her cats. She got hot and sat down in the tub and took a shower. She feels like she has "hit a brick wall". She is wanting Tysabri infusion today, aware we are closed tomorrow. Advised I am not sure if infusion suite can do today since we close at 5pm and usually infusion q28 days. I will speak with Dr. Felecia Shelling and call back with recommendation. She does not want steroids.

## 2018-10-15 NOTE — Telephone Encounter (Signed)
I called pt. Dr. Felecia Shelling is not going to approve to do infusion sooner than q28 days. He recommends she try taking extra dose of ritalin today to see if sx improve. We will plan on her coming Monday for infusion. She verbalized understanding.

## 2018-10-15 NOTE — Telephone Encounter (Signed)
Pt called to inform us that she will be sending a My chart message with details but wanted RN to call her back to discuss about her Tysabri. Please advise.

## 2018-10-22 ENCOUNTER — Telehealth: Payer: Self-pay | Admitting: Adult Health

## 2018-10-22 NOTE — Telephone Encounter (Signed)
error 

## 2018-11-21 ENCOUNTER — Other Ambulatory Visit: Payer: Self-pay | Admitting: Neurology

## 2018-12-14 ENCOUNTER — Other Ambulatory Visit: Payer: Self-pay | Admitting: *Deleted

## 2018-12-14 MED ORDER — METHYLPHENIDATE HCL 10 MG PO TABS: 10.0000 mg | ORAL_TABLET | Freq: Four times a day (QID) | ORAL | 0 refills | Status: DC

## 2018-12-14 NOTE — Progress Notes (Signed)
Pt here today for infusion and requested refill on methylphenidate. Sent request to MD.

## 2018-12-18 ENCOUNTER — Other Ambulatory Visit: Payer: Medicare Other

## 2018-12-18 ENCOUNTER — Ambulatory Visit: Payer: Medicare Other

## 2018-12-25 ENCOUNTER — Other Ambulatory Visit: Payer: Self-pay | Admitting: Neurology

## 2019-01-04 ENCOUNTER — Telehealth: Payer: Self-pay | Admitting: *Deleted

## 2019-01-04 NOTE — Telephone Encounter (Signed)
Faxed completed/signed Tysabri pt status report and reauth questionnaire to [facility] at [phone]. Received confirmation.   Received fax back from touch prescribing program stating pt re-authorized 01/04/19-08/06/2019. Pt enrollment #: [id number]. Account: [facility]. Site auth #: [id number].

## 2019-01-27 ENCOUNTER — Ambulatory Visit: Payer: Medicare Other

## 2019-01-27 ENCOUNTER — Other Ambulatory Visit: Payer: Medicare Other

## 2019-02-08 ENCOUNTER — Other Ambulatory Visit: Payer: Self-pay | Admitting: *Deleted

## 2019-02-08 MED ORDER — OXYBUTYNIN CHLORIDE ER 10 MG PO TB24: 10.0000 mg | ORAL_TABLET | Freq: Every day | ORAL | 0 refills | Status: DC

## 2019-02-25 ENCOUNTER — Encounter: Payer: Self-pay | Admitting: Neurology

## 2019-02-25 ENCOUNTER — Telehealth (INDEPENDENT_AMBULATORY_CARE_PROVIDER_SITE_OTHER): Payer: Medicare Other | Admitting: Neurology

## 2019-02-25 ENCOUNTER — Telehealth: Payer: Self-pay | Admitting: *Deleted

## 2019-02-25 DIAGNOSIS — N319 Neuromuscular dysfunction of bladder, unspecified: Secondary | ICD-10-CM | POA: Diagnosis not present

## 2019-02-25 DIAGNOSIS — G35 Multiple sclerosis: Secondary | ICD-10-CM | POA: Diagnosis not present

## 2019-02-25 DIAGNOSIS — F32A Depression, unspecified: Secondary | ICD-10-CM

## 2019-02-25 DIAGNOSIS — F329 Major depressive disorder, single episode, unspecified: Secondary | ICD-10-CM

## 2019-02-25 DIAGNOSIS — Z79899 Other long term (current) drug therapy: Secondary | ICD-10-CM

## 2019-02-25 DIAGNOSIS — R269 Unspecified abnormalities of gait and mobility: Secondary | ICD-10-CM

## 2019-02-25 DIAGNOSIS — R5383 Other fatigue: Secondary | ICD-10-CM

## 2019-02-25 NOTE — Telephone Encounter (Signed)
Called and spoke with pt. Updated med list, pharmacy, allergies for mychart VV today at 1pm with Dr. Felecia Shelling.   Her last Tysabri infusion:around 02/08/2019. Next: 03/08/2019

## 2019-02-25 NOTE — Progress Notes (Signed)
[facility]  PATIENT: Kellie [name] DOB: December 14, 1960  REFERRING CLINICIAN: Vernona Rieger HISTORY FROM: Patient  REASON FOR VISIT: Multiple Sclerosis   HISTORICAL  CHIEF COMPLAINT:  No chief complaint on file.   HISTORY OF PRESENT ILLNESS:  Kellie [name] is a 58 y.o. woman with MS diagnoses in 1997.    Update 02/25/2019: Virtual Visit via Video Note I connected with Kellie [name]  on 02/25/19 at  1:00 PM EST by a video enabled telemedicine application and verified that I am speaking with the correct person.  I discussed the limitations of evaluation and management by telemedicine and the availability of in person appointments. The patient expressed understanding and agreed to proceed.  History of Present Illness: She is on Tysabri and tolerates it well.  Her last JCV Ab was in July (negative)  She feels her gait is mildly worse.  Balance is off.  She feels strength and spasticity are the same though.   She notes some dysesthesias which are stable.   Gabapentin and lamotrigine help the dysesthesias.   She does Pharmacist, hospital.   Bladder function is about the same.   She takes Myrbetriq, oxybutynin and desmopressin.   She is also on nitrofurantoin but takes cipro if infection.   Her GFR was reduced at her physical but creatinine was ok (0.9) earlier in the year.    Mood is about the same and she is on Effexor.  She has anxiety, helped by xanax.  She is sleeping well.  Fatigue and attention deficit are better with Ritalin.       Observations/Objective:    She is a well-developed well-nourished woman in no acute distress.  The head is normocephalic and atraumatic.  Sclera are anicteric.  Visible skin appears normal.  The neck has a good range of motion.    She is alert and fully oriented with fluent speech and good attention, knowledge and memory.  Extraocular muscles are intact.  Facial strength is normal.  She appears to have normal strength in the arms.  Rapid  alternating movements and finger-nose-finger are performed well.   Assessment and Plan: Multiple sclerosis (HCC)  Bladder neurogenesis  Depression, unspecified depression type  Abnormality of gait  Other fatigue  1.  Continue Tysabri.  We will check JCV Ab at next infusion.  Sometime in 2020 we will check an MRI of the brain to determine if there is any subclinical progression.  If present, consider switching to a different DMT. 2.   She will continue her medications.  She does not need any refills at this time. 3.   Stay active and exercise as tolerated 4.   Return in 6 months or sooner for new or worsening neurologic symptoms  Follow Up Instructions: I discussed the assessment and treatment plan with the patient. The patient was provided an opportunity to ask questions and all were answered. The patient agreed with the plan and demonstrated an understanding of the instructions.    The patient was advised to call back or seek an in-person evaluation if the symptoms worsen or if the condition fails to improve as anticipated.  I provided 25 minutes of non-face-to-face time during this encounter.   Update 08/20/2018 (virtual) She is on Tysabri and tolerates it well.   No recent exacerbation. She is JCV Ab negative (0.13).    Her gait is about the same.  She denies any recent falls.  She has some weakness, right > left in the legs.  She notes numbness and tingling.  Gabapentin and lamotrigine have helped some.  She moves her legs a lot as staying still worsens the dysesthesia.  She has urinary urgency and is better with oxybutynin and night time desmopressin.   She remains active and does Jazzercise and other exercise practically every day.      She has fatigue.  Ritalin has helped.   She sleeps well with night time valium.  She has some anxiety and takes xanax as needed _____________________________________  Update 02/12/2018: She denies any exacerbation and feels her MS has been  mostly stable.  She is on Tysabri as she tolerates it well.  She is trying to remain active and does Jazzercise 5-6 times a week.  Her gait is mildly ataxic and she stumbles with occasional falls.  One fall recently she landed on her right knee and continues to have pain in the right knee.  She notes some increased spasticity in the legs, right more than left.  The right leg is a little weaker.  She continues to have bowel and bladder difficulties.  She does self catheterization.  She sometimes has to manually help evacuate..  Although she has fatigue.  It does not prevent her from doing what she wants to do return has helped this some.  She notes some difficulties with word finding that is also helped by Ritalin.  She sleeps well most nights.  Mood is doing well.  She wishes to know if she can take elderberry syrup.  Update 08/07/2017:   She feels her MS is mostly stable.  She denies any exacerbations.  She tolerates it well.  She has been JCV antibody negative, last tested about 6 months ago.  She would like to get Tysabri a couple days early as she feels more tired the last few days of every cycle and also notes more mental fog the last few days of every cycle.  MRI of the brain and cervical spine 03/20/2017 did not show new lesions.  She feels her gait and balance are doing the same.    She trips a lot and stumbles but only has some falls.  She fell when stepping over her dog and hit her chest hard.    Bladder frequency and urgency helped by Myrbetriq and oxybutynin.    She needs to catheterize.   She sees Dr. Sherron Monday.   Vision I doing well.   She sees ophthalmology regularly.      She has fatigue, especially the last few She notes mental fog with reduced STM and word finding difficulty.  She has trouble with parallel processing and executive function tasks as well.  Ritalin has helped a little bit but she takes only 20 mg most morning and skips the pm dose.    She has depression that is stable .   She  notes some financial stress, especially with her medicare supplement expenses,      Update 02/06/2017:    Her MS continues to do well. She has been on Tysabri since 2008 and she tolerates it well. Last JCV antibody test 08/21/2016 was negative at 0.16.     MRIs of the brain and cervical spine performed in 2016 showed plaques consistent with MS but nothing appeared to be acute. The cervical spine shows at least 4 plaques.  Her gait is doing ok and she does Jazzercise and strength training 5 days a week.   Strength ansd sensation are unchanged.  She fell last week but  only  hit hip and no injury.   Years ago, she fractured her right hip with a fall.    Bladder is doing about the same but she still has a lot of constipation.    Vision is doing well  She notes a lot of stress, mostly financial as her supplemental insurance is high and she does not qualify for medicaid.  Depression is stable.  She has some decreased focus and attention..   She notes some fatigue.    Sleep is good with valium.   From 07/29/2016: MS:  She has been on Tysabri since 2008. She tolerates it well and her MS has been mostly stable while she has been on it.   The JCV Ab (11/10/2015) negative at 0.12   She denies any new exacerbations though gait has worsened some in last couple years.   The last MRI of the brain and cervical spine was performed in 2016. They showed no new lesions.  There are at least 4 plaques in the cervical spine.   Gait/strength/sensation:  She fell in March hitting her head and had a mild concussion (no LOC).   Balance is poor and she falls occasionally.    She exercise regularly with Jazzercise.  She has mild weakness and spasticity in her legs.      Her the right leg seems weaker than the left.  No arm weakness but she sometimes drops items.    Left leg has reduced ROM form fracture in past.     She continues to have hand and leg tingling, helped by gabapentin.  The addition of lamotrigine has helped more.   She  tolerates it well.    Gait issues are worse when she is tired or when hot.      Bladder/bowel:   She has had more UTI's.   She was recently switched from nitrofurantoin to Cipro.   She sees Dr. Perley Jain.   She continues to have problems with urinary urgency and frequency.  She does intermittent catheterization every few hous. She is currently on oxybutynin combined with Myrbetriq bid with benefit.    She has severe constipation but Linzess caused constipation.    With the desmopressin she doesn't have to wake up to catheterize herself or because of incontinence.  Fatigue:   She has fatigue, about the same as last visit, but she remains active. She does Jazzercise 5 days a week over 8 different classes. She feels better when she does her exercises.  Mood:   She has had depression in the past but is generally feeling good currently.  Her son was recently injured doing a Building control surveyor.   She has financial worries.   She has some anxiety also.    Effexor helps some and is well tolerated  Cognition:   She has mild cognitive issues and has some verbal fluency issues.  She has mild short term memory issues and feels focus/atention is decreased.  Ritalin helps attention and focus some.   She feels these issues have been stable for the most part.  Pain:    The right leg is still painful intermittently.   She sees Dr. Dewaine Oats (Ortho)  A trochanteric bursa injection last year only helped a couple days.      Sleep:   She has insomnia that is better with Valium and desmopressin.   She falls asleep easily and stays asleep most of the night.      MS History:  She presented with left sided numbness in  September 1997.   She underwent an MRI and a lumbar puncture and was told MS was unlikely.   She had right sided numbness in January 1998 and had brain and spine MRI.    There were changes suggestive of MS and she was diagnosed in January 1998.   She was started on Betaseron.   She was on Betaseron x 10 years and then  switched to Avonex for a year due to tolerability issues.   She had clinical exacerbations and MRI changes and then went on Tysabri in 2007 or 2008 and has had 92 infusions, her last infusion being 03/02/15.    She is transferring care from [facility] to [facility].  She tolerates Tysabri well.   She is JCV Ab negative.     An MRI of the spinal cord dated 02/02/2013 shows demyelinating plaque to the left adjacent to C3, anteriorly adjacent to C4, to the right adjacent to C5-C6, posteriorly to the right adjacent to C6-C7. When that study was compared to an MRI dated 12/29/2006, there was no definite interval change. MRI of the brain the same day was abnormal showing signal changes compatible with the diagnosis of multiple sclerosis. Compared to the prior study dated 02/23/2012, there was no definite interval change.      MRI brain and cervical spine (09/2014) showing 4 cervical spine foci and typical demyelination plaques in brain (brain unchanged compared to 2012)..    She had MRI 05/2016 after a fall.       REVIEW OF SYSTEMS:  Constitutional: No fevers, chills, sweats, or change in appetite.   Notes fatigue and some insomnia Eyes: No visual changes, double vision, eye pain Ear, nose and throat: No hearing loss, ear pain, nasal congestion, sore throat Cardiovascular: No chest pain, palpitations Respiratory:  No shortness of breath at rest or with exertion.   No wheezes GastrointestinaI: No nausea, vomiting, diarrhea, abdominal pain.   She has dysphagia Genitourinary: as above. Musculoskeletal:  as above iiIntegumentary: No rash, pruritus, skin lesions Neurological: as above Psychiatric: Mild depression at this time.  Mild anxiety Endocrine: No palpitations, diaphoresis, change in appetite, change in weigh or increased thirst Hematologic/Lymphatic:  No anemia, purpura, petechiae. Allergic/Immunologic: No itchy/runny eyes, nasal congestion, recent allergic reactions,  rashes  ALLERGIES: Allergies  Allergen Reactions  . Erythromycin Nausea And Vomiting  . Clindamycin/Lincomycin   . Dilaudid [Hydromorphone Hcl] Other (See Comments)    Hallucinations  . Sulfa Antibiotics     HOME MEDICATIONS:  Current Outpatient Medications:  .  ALPRAZolam (XANAX) 0.25 MG tablet, TAKE 1 TABLET BY MOUTH qHS AS NEEDED FOR ANXIETY, Disp: 90 tablet, Rfl: 0 .  Ascorbic Acid (VITAMIN C PO), Take 500 mg by mouth daily., Disp: , Rfl:  .  Calcium Carbonate-Vitamin D 600-125 MG-UNIT TABS, Take by mouth., Disp: , Rfl:  .  cetirizine (ZYRTEC) 1 MG/ML syrup, Take 5 mg by mouth., Disp: , Rfl:  .  Cholecalciferol (VITAMIN D3 PO), Take 1,000 Units by mouth daily., Disp: , Rfl:  .  ciprofloxacin (CIPRO) 250 MG tablet, Take 1 tablet by mouth daily as needed., Disp: , Rfl:  .  desmopressin (DDAVP) 0.1 MG tablet, TAKE 1 TABLET BY MOUTH EVERY DAY, Disp: 90 tablet, Rfl: 3 .  diazepam (VALIUM) 5 MG tablet, TAKE ONE TABLET BY MOUTH TWO TIMES DAILY AS NEEDED (Patient taking differently: Take 7.5 mg by mouth at bedtime as needed. TAKE ONE TABLET BY MOUTH TWO TIMES DAILY AS NEEDED), Disp: 180 tablet, Rfl: 1 .  famciclovir (FAMVIR) 250 MG tablet, TAKE 1 TABLET BY MOUTH TWICE A DAY, Disp: , Rfl:  .  Fish Oil-Cholecalciferol (FISH OIL + D3) 1000-1000 MG-UNIT CAPS, Take 1,200 mg by mouth daily. , Disp: , Rfl:  .  gabapentin (NEURONTIN) 600 MG tablet, TAKE 2 TABLETS BY MOUTH 4 TIMES A DAY (Patient taking differently: Take 600 mg by mouth at bedtime. TAKE 2 TABLETS BY MOUTH 4 TIMES A DAY), Disp: 720 tablet, Rfl: 0 .  lamoTRIgine (LAMICTAL) 100 MG tablet, TAKE 1 TABLET BY MOUTH TWICE A DAY, Disp: 180 tablet, Rfl: 3 .  methylphenidate (RITALIN) 10 MG tablet, Take 1 tablet (10 mg total) by mouth 4 (four) times daily. (Patient taking differently: Take 10 mg by mouth 4 (four) times daily. Takes 2 tablets in the morning and 2 tablets midday), Disp: 360 tablet, Rfl: 0 .  MYRBETRIQ 50 MG TB24 tablet, TAKE 1  TABLET ONCE DAILY. (Patient taking differently: Take 50 mg by mouth 2 (two) times daily. ), Disp: 30 tablet, Rfl: 6 .  natalizumab 300 mg in sodium chloride 0.9 % 100 mL, Inject into the vein., Disp: , Rfl:  .  nitrofurantoin (MACRODANTIN) 100 MG capsule, Take 1 capsule (100 mg total) by mouth at bedtime., Disp: 90 capsule, Rfl: 0 .  omeprazole (PRILOSEC) 20 MG capsule, 2 (two) times daily., Disp: , Rfl:  .  oxybutynin (DITROPAN-XL) 10 MG 24 hr tablet, Take 1 tablet (10 mg total) by mouth daily. As directed, Disp: 90 tablet, Rfl: 0 .  simvastatin (ZOCOR) 40 MG tablet, TAKE 1 TABLET AT BEDTIME FOR CHOLESTEROL, Disp: , Rfl:  .  venlafaxine XR (EFFEXOR-XR) 150 MG 24 hr capsule, TAKE 2 CAPSULES (300 MG TOTAL) BY MOUTH DAILY. (Patient taking differently: Take 150 mg by mouth 2 (two) times daily. ), Disp: 180 capsule, Rfl: 3 .  vitamin B-12 (CYANOCOBALAMIN) 1000 MCG tablet, Take 2,500 mcg by mouth daily. , Disp: , Rfl:  .  Wheat Dextrin (BENEFIBER) POWD, Take 1 Dose by mouth every morning., Disp: , Rfl:    PAST MEDICAL HISTORY: Past Medical History:  Diagnosis Date  . Chronic kidney disease   . Complication of anesthesia    pt states she got thrush after anesthesia  . Multiple sclerosis (HCC)   . Neuropathy   . Vision abnormalities     PAST SURGICAL HISTORY: Past Surgical History:  Procedure Laterality Date  . CARPAL TUNNEL RELEASE Right    2010  . CARPAL TUNNEL RELEASE Left   . CESAREAN SECTION    . COLONOSCOPY  07/26/14  . ENDOMETRIAL ABLATION     2007  . HIP FRACTURE SURGERY    . hysterctomy    . TUBAL LIGATION      FAMILY HISTORY: Family History  Problem Relation Age of Onset  . Lung cancer Mother   . Dementia Father   . Diverticulitis Father   . Pancreatitis Father   . Breast cancer Cousin     SOCIAL HISTORY:  Social History   Socioeconomic History  . Marital status: Legally Separated    Spouse name: Not on file  . Number of children: Not on file  . Years of  education: Not on file  . Highest education level: Not on file  Occupational History  . Not on file  Tobacco Use  . Smoking status: Former Smoker    Quit date: 03/29/1981    Years since quitting: 37.9  . Smokeless tobacco: Never Used  Substance and Sexual Activity  . Alcohol use:  Yes    Alcohol/week: 0.0 standard drinks    Comment: Occasional  . Drug use: No  . Sexual activity: Not on file  Other Topics Concern  . Not on file  Social History Narrative  . Not on file   Social Determinants of Health   Financial Resource Strain:   . Difficulty of Paying Living Expenses: Not on file  Food Insecurity:   . Worried About Programme researcher, broadcasting/film/videounning Out of Food in the Last Year: Not on file  . Ran Out of Food in the Last Year: Not on file  Transportation Needs:   . Lack of Transportation (Medical): Not on file  . Lack of Transportation (Non-Medical): Not on file  Physical Activity:   . Days of Exercise per Week: Not on file  . Minutes of Exercise per Session: Not on file  Stress:   . Feeling of Stress : Not on file  Social Connections:   . Frequency of Communication with Friends and Family: Not on file  . Frequency of Social Gatherings with Friends and Family: Not on file  . Attends Religious Services: Not on file  . Active Member of Clubs or Organizations: Not on file  . Attends BankerClub or Organization Meetings: Not on file  . Marital Status: Not on file  Intimate Partner Violence:   . Fear of Current or Ex-Partner: Not on file  . Emotionally Abused: Not on file  . Physically Abused: Not on file  . Sexually Abused: Not on file     PHYSICAL EXAM from 02/12/2018  There were no vitals filed for this visit.  There is no height or weight on file to calculate BMI.   General: The patient is well-developed and well-nourished and in no acute distress   Neurologic Exam  Mental status: The patient is alert and oriented x 3 at the time of the examination. The patient has apparent normal recent and  remote memory, with an apparently normal attention span and concentration ability.   Speech is normal.  Cranial nerves: Extraocular movements are full.  Facial strength and sensation is normal.  The trapezius strength is normal.  The tongue is midline, and the patient has symmetric elevation of the soft palate. No obvious hearing deficits are noted.  Motor:  Muscle bulk is normal.    Muscle tone is increased in the legs, right greater than left.  Strength is 5/5.  Sensory: Sensory testing is intact to touch and vibration.  Coordination: Cerebellar testing shows good finger-nose-finger but mildly reduced heel-to-shin.  Gait and station: Station is normal.   The gait is mildly wide and the tandem gait is moderately wide.  Romberg is negative.  Reflexes: Deep tendon reflexes are increased bilaterally in legs.  There is spread of the reflexes at the knees.. Plantar responses are normal.     Destinie Thornsberry A. Epimenio FootSater, MD, PhD 02/25/2019, 1:22 PM Certified in Neurology, Clinical Neurophysiology, Sleep Medicine, Pain Medicine and Neuroimaging  [facility] [address], [address] [place], [place] [address] [phone]

## 2019-02-25 NOTE — Telephone Encounter (Signed)
Called, LVM for pt letting her know I schedule her next follow up for 08/26/18 at 2:30pm with Dr. Felecia Shelling for 6 month f/u. Asked her to call back to r/s if this does not work.

## 2019-02-27 ENCOUNTER — Other Ambulatory Visit: Payer: Self-pay | Admitting: Neurology

## 2019-03-08 ENCOUNTER — Telehealth: Payer: Self-pay | Admitting: *Deleted

## 2019-03-08 ENCOUNTER — Other Ambulatory Visit: Payer: Self-pay | Admitting: *Deleted

## 2019-03-08 MED ORDER — METHYLPHENIDATE HCL 10 MG PO TABS: 10.0000 mg | ORAL_TABLET | Freq: Four times a day (QID) | ORAL | 0 refills | Status: DC

## 2019-03-08 NOTE — Addendum Note (Signed)
Addended by: Inis Sizer D on: 03/08/2019 03:07 PM   Modules accepted: Orders

## 2019-03-08 NOTE — Telephone Encounter (Signed)
Placed JCV lab in [facility] lock box for routine lab pick up. Results pending. 

## 2019-03-09 LAB — CBC WITH DIFFERENTIAL/PLATELET
Basophils Absolute: 0.1 10*3/uL (ref 0.0–0.2)
Basos: 1 %
EOS (ABSOLUTE): 0.5 10*3/uL — ABNORMAL HIGH (ref 0.0–0.4)
Eos: 6 %
Hematocrit: 37.2 % (ref 34.0–46.6)
Hemoglobin: 12.3 g/dL (ref 11.1–15.9)
Immature Grans (Abs): 0 10*3/uL (ref 0.0–0.1)
Immature Granulocytes: 1 %
Lymphocytes Absolute: 3.4 10*3/uL — ABNORMAL HIGH (ref 0.7–3.1)
Lymphs: 43 %
MCH: 30.4 pg (ref 26.6–33.0)
MCHC: 33.1 g/dL (ref 31.5–35.7)
MCV: 92 fL (ref 79–97)
Monocytes Absolute: 0.6 10*3/uL (ref 0.1–0.9)
Monocytes: 8 %
NRBC: 1 % — ABNORMAL HIGH (ref 0–0)
Neutrophils Absolute: 3.2 10*3/uL (ref 1.4–7.0)
Neutrophils: 41 %
Platelets: 196 10*3/uL (ref 150–450)
RBC: 4.04 x10E6/uL (ref 3.77–5.28)
RDW: 13.9 % (ref 11.7–15.4)
WBC: 7.8 10*3/uL (ref 3.4–10.8)

## 2019-03-16 NOTE — Telephone Encounter (Signed)
JCV ab drawn on 03/08/19 negative, index: 0.13

## 2019-04-05 NOTE — Addendum Note (Signed)
Addended by: Tamera Stands D on: 04/05/2019 03:40 PM   Modules accepted: Orders

## 2019-04-22 ENCOUNTER — Ambulatory Visit: Payer: Medicare Other

## 2019-04-22 ENCOUNTER — Other Ambulatory Visit: Payer: Medicare Other

## 2019-05-07 ENCOUNTER — Other Ambulatory Visit: Payer: Self-pay | Admitting: Neurology

## 2019-06-02 ENCOUNTER — Ambulatory Visit
Admission: RE | Admit: 2019-06-02 | Discharge: 2019-06-02 | Disposition: A | Discharge status: Home / Self Care | Payer: Medicare Other | Source: Ambulatory Visit | Attending: Internal Medicine | Admitting: Internal Medicine

## 2019-06-02 ENCOUNTER — Other Ambulatory Visit: Payer: Self-pay

## 2019-06-02 DIAGNOSIS — M810 Age-related osteoporosis without current pathological fracture: Secondary | ICD-10-CM

## 2019-06-02 DIAGNOSIS — Z1231 Encounter for screening mammogram for malignant neoplasm of breast: Secondary | ICD-10-CM

## 2019-06-07 ENCOUNTER — Other Ambulatory Visit: Payer: Self-pay | Admitting: *Deleted

## 2019-06-07 MED ORDER — DIAZEPAM 5 MG PO TABS: ORAL_TABLET | ORAL | 1 refills | Status: DC

## 2019-06-28 ENCOUNTER — Telehealth: Payer: Self-pay | Admitting: *Deleted

## 2019-06-28 NOTE — Telephone Encounter (Signed)
Pt here today for Tysbari infusion. She provided vaccination card for [employer] vaccine. She received 05/30/19 and 06/20/19. Sent copy of card to be scanned to her chart.

## 2019-07-05 ENCOUNTER — Telehealth: Payer: Self-pay | Admitting: *Deleted

## 2019-07-05 NOTE — Telephone Encounter (Signed)
Received fax from touch prescribing program that pt re-authorized for Tysabri from 07/05/19-02/05/20. Pt enrollment number: [id number]. Account: [facility]. Site auth number: [id number].

## 2019-07-05 NOTE — Telephone Encounter (Signed)
Faxed completed/signed Tysabri pt status report and reauth questionnaire to [employer] at [phone]. Received confirmation.  

## 2019-07-15 ENCOUNTER — Other Ambulatory Visit: Payer: Self-pay | Admitting: Neurology

## 2019-07-19 ENCOUNTER — Other Ambulatory Visit: Payer: Self-pay | Admitting: Neurology

## 2019-08-02 ENCOUNTER — Other Ambulatory Visit: Payer: Self-pay | Admitting: Neurology

## 2019-08-23 ENCOUNTER — Telehealth: Payer: Self-pay

## 2019-08-23 ENCOUNTER — Telehealth: Payer: Self-pay | Admitting: *Deleted

## 2019-08-23 DIAGNOSIS — G35 Multiple sclerosis: Secondary | ICD-10-CM

## 2019-08-23 NOTE — Telephone Encounter (Signed)
Placed JCV lab in [facility] lock box for routine lab pick up. Results pending. 

## 2019-08-23 NOTE — Telephone Encounter (Signed)
Pt is here for her infusion. Dr. Epimenio Foot gave VO for her JCV to be drawn today. Order placed. Ladona Ridgel, RN in intrafusion aware.

## 2019-08-26 ENCOUNTER — Other Ambulatory Visit: Payer: Self-pay

## 2019-08-26 ENCOUNTER — Ambulatory Visit (INDEPENDENT_AMBULATORY_CARE_PROVIDER_SITE_OTHER): Payer: Medicare Other | Admitting: Neurology

## 2019-08-26 ENCOUNTER — Encounter: Payer: Self-pay | Admitting: Neurology

## 2019-08-26 VITALS — BP 122/80 | HR 68 | Ht 62.0 in | Wt 144.0 lb

## 2019-08-26 DIAGNOSIS — Z79899 Other long term (current) drug therapy: Secondary | ICD-10-CM | POA: Diagnosis not present

## 2019-08-26 DIAGNOSIS — G35 Multiple sclerosis: Secondary | ICD-10-CM | POA: Diagnosis not present

## 2019-08-26 DIAGNOSIS — F988 Other specified behavioral and emotional disorders with onset usually occurring in childhood and adolescence: Secondary | ICD-10-CM

## 2019-08-26 DIAGNOSIS — N319 Neuromuscular dysfunction of bladder, unspecified: Secondary | ICD-10-CM | POA: Diagnosis not present

## 2019-08-26 DIAGNOSIS — R269 Unspecified abnormalities of gait and mobility: Secondary | ICD-10-CM

## 2019-08-26 DIAGNOSIS — R5383 Other fatigue: Secondary | ICD-10-CM

## 2019-08-26 NOTE — Progress Notes (Signed)
[facility]  PATIENT: Kellie [name] DOB: 06/05/1960  REFERRING CLINICIAN: Vernona Rieger HISTORY FROM: Patient  REASON FOR VISIT: Multiple Sclerosis   HISTORICAL  CHIEF COMPLAINT:  Chief Complaint  Patient presents with  . Follow-up    Rm 12 alone- reports some decline since last visit. On tysabri last infusion was 08/23/2019    HISTORY OF PRESENT ILLNESS:  Kellie [name] is a 59 y.o. woman with relapsing remitting MS diagnoses in 1997.   Update 08/26/2018: She feels her MS is stable and she continues on Tysabri.    Her last JCV Ab was in December 2020 (negative).     She feels her gait is mildly worse.  Balance is off and falls about once a month. She exercises (Jazzercise) multiple times a week.   She feels strength and spasticity are the same, a little worse on the right.  She has some dysesthesias.   Gabapentin and lamotrigine help the dysesthesias.      Bladder urgency is about the same.   She takes Myrbetriq, oxybutynin and desmopressin.   Due to frequent UTI, she is on nitrofurantoin.  This has helped and no   Mood is about the same and she is on Effexor.  Xanax has helped her anxiety.    She is sleeping well.  Fatigue and attention deficit are better with Ritalin.     She has osteoporosis.  She is waiting to stop a medication as she has new dental implants.   She has her Covid 19 vaccinations.       MS History:  She presented with left sided numbness in September 1997.   She underwent an MRI and a lumbar puncture and was told MS was unlikely.   She had right sided numbness in January 1998 and had brain and spine MRI.    There were changes suggestive of MS and she was diagnosed in January 1998.   She was started on Betaseron.   She was on Betaseron x 10 years and then switched to Avonex for a year due to tolerability issues.   She had clinical exacerbations and MRI changes and then went on Tysabri in 2007 or 2008 and has had 92 infusions, her last infusion being  03/02/15.    She is transferring care from [facility] to [facility].  She tolerates Tysabri well.   She is JCV Ab negative.     An MRI of the spinal cord dated 02/02/2013 shows demyelinating plaque to the left adjacent to C3, anteriorly adjacent to C4, to the right adjacent to C5-C6, posteriorly to the right adjacent to C6-C7. When that study was compared to an MRI dated 12/29/2006, there was no definite interval change. MRI of the brain the same day was abnormal showing signal changes compatible with the diagnosis of multiple sclerosis. Compared to the prior study dated 02/23/2012, there was no definite interval change.      MRI brain and cervical spine (09/2014) showing 4 cervical spine foci and typical demyelination plaques in brain (brain unchanged compared to 2012)..    She had MRI 05/2016 after a fall.  Repeat MRI of the brain and cervical spine 03/20/2017 did not show any new lesions compared to the 2016 MRIs.    REVIEW OF SYSTEMS:  Constitutional: No fevers, chills, sweats, or change in appetite.   Notes fatigue and some insomnia Eyes: No visual changes, double vision, eye pain Ear, nose and throat: No hearing loss, ear pain, nasal congestion, sore throat Cardiovascular: No chest  pain, palpitations Respiratory:  No shortness of breath at rest or with exertion.   No wheezes GastrointestinaI: No nausea, vomiting, diarrhea, abdominal pain.   She has dysphagia Genitourinary: as above. Musculoskeletal:  as above iiIntegumentary: No rash, pruritus, skin lesions Neurological: as above Psychiatric: Mild depression at this time.  Mild anxiety Endocrine: No palpitations, diaphoresis, change in appetite, change in weigh or increased thirst Hematologic/Lymphatic:  No anemia, purpura, petechiae. Allergic/Immunologic: No itchy/runny eyes, nasal congestion, recent allergic reactions, rashes  ALLERGIES: Allergies  Allergen Reactions  . Erythromycin Nausea And Vomiting  . Clindamycin/Lincomycin   .  Dilaudid [Hydromorphone Hcl] Other (See Comments)    Hallucinations  . Sulfa Antibiotics     HOME MEDICATIONS:  Current Outpatient Medications:  .  ALPRAZolam (XANAX) 0.25 MG tablet, TAKE 1 TABLET BY MOUTH AT BEDTIME AS NEEDED FOR ANXIETY (Patient taking differently: TAKE 1/2 and 1 TABLET BY MOUTH AT BEDTIME AS NEEDED FOR ANXIETY), Disp: 90 tablet, Rfl: 0 .  Ascorbic Acid (VITAMIN C PO), Take 500 mg by mouth daily., Disp: , Rfl:  .  Calcium Carbonate-Vitamin D 600-125 MG-UNIT TABS, Take by mouth., Disp: , Rfl:  .  cetirizine (ZYRTEC) 1 MG/ML syrup, Take 5 mg by mouth., Disp: , Rfl:  .  Cholecalciferol (VITAMIN D3 PO), Take 1,000 Units by mouth daily., Disp: , Rfl:  .  ciprofloxacin (CIPRO) 250 MG tablet, Take 1 tablet by mouth daily as needed., Disp: , Rfl:  .  desmopressin (DDAVP) 0.1 MG tablet, TAKE 1 TABLET BY MOUTH EVERY DAY, Disp: 90 tablet, Rfl: 3 .  diazepam (VALIUM) 5 MG tablet, TAKE ONE TABLET BY MOUTH TWO TIMES DAILY AS NEEDED (Patient taking differently: TAKE 1 1/2 TABLET BY mouth at bedtime), Disp: 180 tablet, Rfl: 1 .  famciclovir (FAMVIR) 250 MG tablet, TAKE 1 TABLET BY MOUTH TWICE A DAY, Disp: , Rfl:  .  Fish Oil-Cholecalciferol (FISH OIL + D3) 1000-1000 MG-UNIT CAPS, Take 3,600 mg by mouth daily. , Disp: , Rfl:  .  gabapentin (NEURONTIN) 600 MG tablet, TAKE 2 TABLETS BY MOUTH 4 TIMES A DAY (Patient taking differently: Take 600 mg by mouth at bedtime. 1 at bedtime), Disp: 720 tablet, Rfl: 0 .  lamoTRIgine (LAMICTAL) 100 MG tablet, TAKE 1 TABLET BY MOUTH TWICE A DAY, Disp: 180 tablet, Rfl: 3 .  methylphenidate (RITALIN) 10 MG tablet, Take 1 tablet (10 mg total) by mouth 4 (four) times daily. Takes 2 tablets in the morning and 2 tablets midday, Disp: 360 tablet, Rfl: 0 .  MYRBETRIQ 50 MG TB24 tablet, TAKE 1 TABLET ONCE DAILY. (Patient taking differently: Take 50 mg by mouth 2 (two) times daily. ), Disp: 30 tablet, Rfl: 6 .  natalizumab (TYSABRI) 300 MG/15ML injection, Inject into  the vein., Disp: , Rfl:  .  natalizumab 300 mg in sodium chloride 0.9 % 100 mL, Inject into the vein., Disp: , Rfl:  .  nitrofurantoin (MACRODANTIN) 100 MG capsule, Take 1 capsule (100 mg total) by mouth at bedtime., Disp: 90 capsule, Rfl: 0 .  omeprazole (PRILOSEC) 20 MG capsule, 2 (two) times daily., Disp: , Rfl:  .  oxybutynin (DITROPAN-XL) 10 MG 24 hr tablet, TAKE 1 TABLET BY MOUTH DAILY. AS DIRECTED, Disp: 90 tablet, Rfl: 0 .  simvastatin (ZOCOR) 40 MG tablet, TAKE 1 TABLET AT BEDTIME FOR CHOLESTEROL, Disp: , Rfl:  .  venlafaxine XR (EFFEXOR-XR) 150 MG 24 hr capsule, TAKE 2 CAPSULES (300 MG TOTAL) BY MOUTH DAILY., Disp: 180 capsule, Rfl: 3 .  vitamin  B-12 (CYANOCOBALAMIN) 1000 MCG tablet, Take 2,500 mcg by mouth daily. , Disp: , Rfl:  .  Wheat Dextrin (BENEFIBER) POWD, Take 1 Dose by mouth every morning., Disp: , Rfl:    PAST MEDICAL HISTORY: Past Medical History:  Diagnosis Date  . Chronic kidney disease   . Complication of anesthesia    pt states she got thrush after anesthesia  . Multiple sclerosis (HCC)   . Neuropathy   . Vision abnormalities     PAST SURGICAL HISTORY: Past Surgical History:  Procedure Laterality Date  . CARPAL TUNNEL RELEASE Right    2010  . CARPAL TUNNEL RELEASE Left   . CESAREAN SECTION    . COLONOSCOPY  07/26/14  . ENDOMETRIAL ABLATION     2007  . HIP FRACTURE SURGERY    . hysterctomy    . TUBAL LIGATION      FAMILY HISTORY: Family History  Problem Relation Age of Onset  . Lung cancer Mother   . Dementia Father   . Diverticulitis Father   . Pancreatitis Father   . Breast cancer Cousin     SOCIAL HISTORY:  Social History   Socioeconomic History  . Marital status: Significant Other    Spouse name: Not on file  . Number of children: Not on file  . Years of education: Not on file  . Highest education level: Not on file  Occupational History  . Not on file  Tobacco Use  . Smoking status: Former Smoker    Quit date: 03/29/1981     Years since quitting: 38.4  . Smokeless tobacco: Never Used  Substance and Sexual Activity  . Alcohol use: Yes    Alcohol/week: 0.0 standard drinks    Comment: Occasional  . Drug use: No  . Sexual activity: Not on file  Other Topics Concern  . Not on file  Social History Narrative  . Not on file   Social Determinants of Health   Financial Resource Strain:   . Difficulty of Paying Living Expenses:   Food Insecurity:   . Worried About Programme researcher, broadcasting/film/video in the Last Year:   . Barista in the Last Year:   Transportation Needs:   . Freight forwarder (Medical):   Marland Kitchen Lack of Transportation (Non-Medical):   Physical Activity:   . Days of Exercise per Week:   . Minutes of Exercise per Session:   Stress:   . Feeling of Stress :   Social Connections:   . Frequency of Communication with Friends and Family:   . Frequency of Social Gatherings with Friends and Family:   . Attends Religious Services:   . Active Member of Clubs or Organizations:   . Attends Banker Meetings:   Marland Kitchen Marital Status:   Intimate Partner Violence:   . Fear of Current or Ex-Partner:   . Emotionally Abused:   Marland Kitchen Physically Abused:   . Sexually Abused:      PHYSICAL EXAM from 02/12/2018  Vitals:   08/26/19 1434  BP: 122/80  Pulse: 68  Weight: 144 lb (65.3 kg)  Height: 5\' 2"  (1.575 m)    Body mass index is 26.34 kg/m.   General: The patient is well-developed and well-nourished and in no acute distress   Neurologic Exam  Mental status: The patient is alert and oriented x 3 at the time of the examination. The patient has apparent normal recent and remote memory, with an apparently normal attention span and concentration ability.  Speech is normal.  Cranial nerves: Extraocular movements are full.  Facial strength and sensation is normal.  The trapezius strength is normal.  The tongue is midline, and the patient has symmetric elevation of the soft palate. No obvious hearing  deficits are noted.  Motor:  Muscle bulk is normal.    Muscle tone is increased in the legs, right greater than left.  Strength is 5/5.  Sensory: Sensory testing is intact to touch and vibration.  Coordination: Cerebellar testing shows good finger-nose-finger but mildly reduced heel-to-shin.  Gait and station: Station is normal.   The gait is mildly wide.  Tandem gait is moderately wide.  Romberg is negative.  Reflexes: Deep tendon reflexes are increased bilaterally in legs.  DTRs are increased at the knees with spread.  There is no ankle clonus.  Plantar responses are normal.    Multiple sclerosis (HCC)  Abnormality of gait  Bladder neurogenesis  High risk medication use  Other fatigue  Attention deficit disorder (ADD) without hyperactivity  1.   Continue Tysabri.  She had a JCV antibody drawn earlier this week but it is not back yet.  We discussed continuing Tysabri as [place] as she is JCV antibody negative and can to doing a switch to different DMT if she converts to positive. 2.   She will continue to stay active and exercise as tolerated.  Advised to take care due to her reduced gait especially when tired or in the heat. 3.    Continue medications including Ritalin for ADD/fatigue. 4.   Return to see [place] in 6 months or sooner if there are new or worsening neurologic symptoms.  Crescencio Jozwiak A. Epimenio Foot, MD, PhD 08/26/2019, 8:10 PM Certified in Neurology, Clinical Neurophysiology, Sleep Medicine, Pain Medicine and Neuroimaging  [facility] [address], [address] [place], [place] [address] [phone]

## 2019-08-30 NOTE — Telephone Encounter (Signed)
JCV ab drawn 08/23/19. Negative, index: 0.18.

## 2019-10-04 ENCOUNTER — Other Ambulatory Visit: Payer: Self-pay | Admitting: Diagnostic Neuroimaging

## 2019-10-25 DIAGNOSIS — M79671 Pain in right foot: Secondary | ICD-10-CM

## 2019-11-20 ENCOUNTER — Other Ambulatory Visit: Payer: Self-pay | Admitting: Neurology

## 2020-01-03 ENCOUNTER — Telehealth: Payer: Self-pay | Admitting: *Deleted

## 2020-01-03 NOTE — Telephone Encounter (Signed)
Faxed completed/signed Tysabri pt status report and reauth questionnaire to [facility] at [phone]. Received confirmation.   Received fax that pt re-authorized for Tysabri 01/03/20-08/06/20. Pt enrollment number: [id number]. Account: [facility]. Site auth number: [id number].

## 2020-01-05 ENCOUNTER — Other Ambulatory Visit: Payer: Self-pay | Admitting: Neurology

## 2020-01-05 NOTE — Telephone Encounter (Addendum)
Checked drug registry. She last refilled diazepam 10/04/19 #180. Last seen 08/26/19 and next f/u 02/09/20.  Called pt to see how she is taking gabapentin 600mg  tablet per Dr. request. Pt reports she takes 1 tablet at bedtime normally and then as needed. When I asked on average how much she takes daily, she reported 1. Advised we will go ahead and send in refills as requested. She verbalized understanding.   I spoke with Dr. Epimenio Foot- He would like to change gabapentin directions to 1-2 capsules daily

## 2020-01-13 ENCOUNTER — Other Ambulatory Visit: Payer: Self-pay | Admitting: *Deleted

## 2020-01-13 MED ORDER — METHYLPHENIDATE HCL 10 MG PO TABS: 10.0000 mg | ORAL_TABLET | Freq: Four times a day (QID) | ORAL | 0 refills | Status: DC

## 2020-01-14 ENCOUNTER — Ambulatory Visit: Payer: Medicare Other | Admitting: Podiatry

## 2020-02-09 ENCOUNTER — Telehealth: Payer: Self-pay | Admitting: Neurology

## 2020-02-09 ENCOUNTER — Encounter: Payer: Self-pay | Admitting: Neurology

## 2020-02-09 ENCOUNTER — Other Ambulatory Visit: Payer: Self-pay

## 2020-02-09 ENCOUNTER — Telehealth: Payer: Self-pay | Admitting: *Deleted

## 2020-02-09 ENCOUNTER — Ambulatory Visit (INDEPENDENT_AMBULATORY_CARE_PROVIDER_SITE_OTHER): Payer: Medicare Other | Admitting: Neurology

## 2020-02-09 VITALS — BP 127/74 | HR 73 | Ht 62.0 in | Wt 146.5 lb

## 2020-02-09 DIAGNOSIS — Z79899 Other long term (current) drug therapy: Secondary | ICD-10-CM

## 2020-02-09 DIAGNOSIS — R5383 Other fatigue: Secondary | ICD-10-CM

## 2020-02-09 DIAGNOSIS — G35 Multiple sclerosis: Secondary | ICD-10-CM

## 2020-02-09 DIAGNOSIS — R269 Unspecified abnormalities of gait and mobility: Secondary | ICD-10-CM | POA: Diagnosis not present

## 2020-02-09 DIAGNOSIS — F988 Other specified behavioral and emotional disorders with onset usually occurring in childhood and adolescence: Secondary | ICD-10-CM

## 2020-02-09 DIAGNOSIS — Z87718 Personal history of other specified (corrected) congenital malformations of genitourinary system: Secondary | ICD-10-CM

## 2020-02-09 DIAGNOSIS — Z87448 Personal history of other diseases of urinary system: Secondary | ICD-10-CM

## 2020-02-09 DIAGNOSIS — F32A Depression, unspecified: Secondary | ICD-10-CM

## 2020-02-09 DIAGNOSIS — R200 Anesthesia of skin: Secondary | ICD-10-CM

## 2020-02-09 NOTE — Telephone Encounter (Signed)
Medicare/state farm order sent to [facility]. No auth they will reach out to the patient to schedule.

## 2020-02-09 NOTE — Telephone Encounter (Signed)
Placed JCV lab in [facility] lock box for routine lab pick up. Results pending. 

## 2020-02-09 NOTE — Progress Notes (Addendum)
[facility]  PATIENT: Kellie [name] DOB: 1960-08-07  REFERRING CLINICIAN: Vernona Rieger HISTORY FROM: Patient  REASON FOR VISIT: Multiple Sclerosis   HISTORICAL  CHIEF COMPLAINT:  Chief Complaint  Patient presents with  . Follow-up    RM 12, alone. Last seen 08/26/19 by AL,NP.   . Multiple Sclerosis    On Tysabri. Last: 01/11/20, next: 02/10/20. Needs JCV LAB TODAY    HISTORY OF PRESENT ILLNESS:  Kellie [name] is a 59 y.o. woman with relapsing remitting MS diagnoses in 1997.   Update 08/26/2018: She feels her MS is stable and she continues on Tysabri.    Her last JCV Ab was 08/23/19 (0.18 negative).  Her last infusion was 01/11/2020 and next infusion is tomorrow.    She feels her gait is a litlte worse with reduced balance and occasional falls.    She exercises (Jazzercise) multiple times a week.   She feels strength and spasticity are the same, a little worse on the right.  She has some dysesthesias.   Gabapentin and lamotrigine help the dysesthesias.     She has urinary hesitancy and self caths up to 10 times a day.   For spasms she takes Myrbetriq, oxybutynin.  At night she takes desmopressin.   Due to frequent UTI, she is on nitrofurantoin.  This has helped and no   Mood is about the same and she is on Effexor.  Xanax has helped her anxiety.   She takes valium 10 mg nightly and is sleeping well now.  Fatigue and attention deficit are better with Ritalin.     She has osteoporosis.  She stopped alendronate but stopped as she has new dental implants.     She has her Covid 19 vaccinations.    She has gained about 5 pounds since her last visit despite staying active with frequent Jazzercise classes.       MS HISTORY:   She presented with left sided numbness in September 1997.   She underwent an MRI and a lumbar puncture and was told MS was unlikely.   She had right sided numbness in January 1998 and had brain and spine MRI.    There were changes suggestive of MS  and she was diagnosed in January 1998.   She was started on Betaseron.   She was on Betaseron x 10 years and then switched to Avonex for a year due to tolerability issues.   She had clinical exacerbations and MRI changes and then went on Tysabri in 2007 or 2008 and has had 92 infusions, her last infusion being 03/02/15.    She is transferring care from [facility] to [facility].  She tolerates Tysabri well.   She is JCV Ab negative.     An MRI of the spinal cord dated 02/02/2013 shows demyelinating plaque to the left adjacent to C3, anteriorly adjacent to C4, to the right adjacent to C5-C6, posteriorly to the right adjacent to C6-C7. When that study was compared to an MRI dated 12/29/2006, there was no definite interval change. MRI of the brain the same day was abnormal showing signal changes compatible with the diagnosis of multiple sclerosis. Compared to the prior study dated 02/23/2012, there was no definite interval change.      MRI brain and cervical spine (09/2014) showing 4 cervical spine foci and typical demyelination plaques in brain (brain unchanged compared to 2012)..    She had MRI 05/2016 after a fall.  Repeat MRI of the brain and cervical spine  03/20/2017 did not show any new lesions compared to the 2016 MRIs.  STUDIES MRI cervical spine 03/21/2017 showed five T2 hyperintense foci within the spinal cord consistent with chronic demyelinating plaque associated with multiple sclerosis. None of these appear to be acute and they did not enhance. When compared to the MRI dated 08/04/2014, there is no interval change.    Multilevel degenerative changes as detailed above. The degenerative changes at C6-C7 have progressed when compared to the previous MRI.  There does not appear to be any definite nerve root compression and there is no spinal stenosis or spinal cord compression.  MRI brain 03/20/2017 multiple T2/FLAIR hyperintense foci in the white matter of the hemispheres and cerebellum in a pattern and  configuration consistent with chronic demyelinating plaque associated with multiple sclerosis. None of the foci appears to be acute. When compared to the MRI dated 08/17/2014, there is no interim change.     There is a normal enhancement pattern and there are no acute findings.   Bone density 06/02/2019 showed The BMD measured at AP Spine L1-L3 is 0.808 g/cm2 with a T-score of -3.0.   Patient is considered OSTEOPOROTIC according to World Health Organization [facility]) criteria.  REVIEW OF SYSTEMS:  Constitutional: No fevers, chills, sweats, or change in appetite.   Notes fatigue and some insomnia Eyes: No visual changes, double vision, eye pain Ear, nose and throat: No hearing loss, ear pain, nasal congestion, sore throat Cardiovascular: No chest pain, palpitations Respiratory:  No shortness of breath at rest or with exertion.   No wheezes GastrointestinaI: No nausea, vomiting, diarrhea, abdominal pain.   She has dysphagia Genitourinary: as above. Musculoskeletal:  as above iiIntegumentary: No rash, pruritus, skin lesions Neurological: as above Psychiatric: Mild depression at this time.  Mild anxiety Endocrine: No palpitations, diaphoresis, change in appetite, change in weigh or increased thirst Hematologic/Lymphatic:  No anemia, purpura, petechiae. Allergic/Immunologic: No itchy/runny eyes, nasal congestion, recent allergic reactions, rashes  ALLERGIES: Allergies  Allergen Reactions  . Erythromycin Nausea And Vomiting  . Clindamycin/Lincomycin   . Dilaudid [Hydromorphone Hcl] Other (See Comments)    Hallucinations  . Sulfa Antibiotics     HOME MEDICATIONS:  Current Outpatient Medications:  .  ALPRAZolam (XANAX) 0.25 MG tablet, TAKE 1 TABLET BY MOUTH AT BEDTIME AS NEEDED FOR ANXIETY (Patient taking differently: TAKE 1 TABLET BY MOUTH AS NEEDED FOR ANXIETY), Disp: 90 tablet, Rfl: 1 .  Ascorbic Acid (VITAMIN C PO), Take 500 mg by mouth daily., Disp: , Rfl:  .  Calcium Carbonate-Vitamin D  600-125 MG-UNIT TABS, Take by mouth., Disp: , Rfl:  .  cetirizine (ZYRTEC) 1 MG/ML syrup, Take 5 mg by mouth., Disp: , Rfl:  .  Cholecalciferol (VITAMIN D3 PO), Take 1,000 Units by mouth daily., Disp: , Rfl:  .  desmopressin (DDAVP) 0.1 MG tablet, TAKE 1 TABLET BY MOUTH EVERY DAY, Disp: 90 tablet, Rfl: 3 .  diazepam (VALIUM) 5 MG tablet, TAKE ONE TABLET BY MOUTH TWO TIMES DAILY AS NEEDED (Patient taking differently: Takes 2 tabs po qhs prn for sleep), Disp: 180 tablet, Rfl: 1 .  famciclovir (FAMVIR) 250 MG tablet, TAKE 1 TABLET BY MOUTH TWICE A DAY, Disp: , Rfl:  .  Fish Oil-Cholecalciferol (FISH OIL + D3) 1000-1000 MG-UNIT CAPS, Take 3,600 mg by mouth daily. , Disp: , Rfl:  .  gabapentin (NEURONTIN) 600 MG tablet, Take 1-2 tablets by mouth daily, Disp: 180 tablet, Rfl: 1 .  lamoTRIgine (LAMICTAL) 100 MG tablet, TAKE 1 TABLET BY MOUTH  TWICE A DAY, Disp: 180 tablet, Rfl: 3 .  methylphenidate (RITALIN) 10 MG tablet, Take 1 tablet (10 mg total) by mouth 4 (four) times daily. Takes 2 tablets in the morning and 2 tablets midday, Disp: 360 tablet, Rfl: 0 .  MYRBETRIQ 50 MG TB24 tablet, TAKE 1 TABLET ONCE DAILY. (Patient taking differently: Take 50 mg by mouth 2 (two) times daily. ), Disp: 30 tablet, Rfl: 6 .  natalizumab (TYSABRI) 300 MG/15ML injection, Inject into the vein., Disp: , Rfl:  .  natalizumab 300 mg in sodium chloride 0.9 % 100 mL, Inject into the vein., Disp: , Rfl:  .  nitrofurantoin (MACRODANTIN) 100 MG capsule, Take 1 capsule (100 mg total) by mouth at bedtime., Disp: 90 capsule, Rfl: 0 .  omeprazole (PRILOSEC) 20 MG capsule, 2 (two) times daily., Disp: , Rfl:  .  oxybutynin (DITROPAN-XL) 10 MG 24 hr tablet, TAKE 1 TABLET BY MOUTH DAILY. AS DIRECTED, Disp: 90 tablet, Rfl: 0 .  simvastatin (ZOCOR) 40 MG tablet, TAKE 1 TABLET AT BEDTIME FOR CHOLESTEROL, Disp: , Rfl:  .  venlafaxine XR (EFFEXOR-XR) 150 MG 24 hr capsule, TAKE 2 CAPSULES (300 MG TOTAL) BY MOUTH DAILY., Disp: 180 capsule, Rfl:  3 .  vitamin B-12 (CYANOCOBALAMIN) 1000 MCG tablet, Take 2,500 mcg by mouth daily. , Disp: , Rfl:  .  Wheat Dextrin (BENEFIBER) POWD, Take 1 Dose by mouth every morning., Disp: , Rfl:  .  ciprofloxacin (CIPRO) 250 MG tablet, Take 1 tablet by mouth daily as needed. (Patient not taking: Reported on 02/09/2020), Disp: , Rfl:    PAST MEDICAL HISTORY: Past Medical History:  Diagnosis Date  . Chronic kidney disease   . Complication of anesthesia    pt states she got thrush after anesthesia  . Multiple sclerosis (HCC)   . Neuropathy   . Vision abnormalities     PAST SURGICAL HISTORY: Past Surgical History:  Procedure Laterality Date  . CARPAL TUNNEL RELEASE Right    2010  . CARPAL TUNNEL RELEASE Left   . CESAREAN SECTION    . COLONOSCOPY  07/26/14  . ENDOMETRIAL ABLATION     2007  . HIP FRACTURE SURGERY    . hysterctomy    . TUBAL LIGATION      FAMILY HISTORY: Family History  Problem Relation Age of Onset  . Lung cancer Mother   . Dementia Father   . Diverticulitis Father   . Pancreatitis Father   . Breast cancer Cousin     SOCIAL HISTORY:  Social History   Socioeconomic History  . Marital status: Significant Other    Spouse name: Not on file  . Number of children: Not on file  . Years of education: Not on file  . Highest education level: Not on file  Occupational History  . Not on file  Tobacco Use  . Smoking status: Former Smoker    Quit date: 03/29/1981    Years since quitting: 38.8  . Smokeless tobacco: Never Used  Substance and Sexual Activity  . Alcohol use: Yes    Alcohol/week: 0.0 standard drinks    Comment: Occasional  . Drug use: No  . Sexual activity: Not on file  Other Topics Concern  . Not on file  Social History Narrative  . Not on file   Social Determinants of Health   Financial Resource Strain:   . Difficulty of Paying Living Expenses: Not on file  Food Insecurity:   . Worried About Programme researcher, broadcasting/film/video in the Last  Year: Not on file  .  Ran Out of Food in the Last Year: Not on file  Transportation Needs:   . Lack of Transportation (Medical): Not on file  . Lack of Transportation (Non-Medical): Not on file  Physical Activity:   . Days of Exercise per Week: Not on file  . Minutes of Exercise per Session: Not on file  Stress:   . Feeling of Stress : Not on file  Social Connections:   . Frequency of Communication with Friends and Family: Not on file  . Frequency of Social Gatherings with Friends and Family: Not on file  . Attends Religious Services: Not on file  . Active Member of Clubs or Organizations: Not on file  . Attends Banker Meetings: Not on file  . Marital Status: Not on file  Intimate Partner Violence:   . Fear of Current or Ex-Partner: Not on file  . Emotionally Abused: Not on file  . Physically Abused: Not on file  . Sexually Abused: Not on file     PHYSICAL EXAM from 02/12/2018  Vitals:   02/09/20 1105  BP: 127/74  Pulse: 73  Weight: 146 lb 8 oz (66.5 kg)  Height: 5\' 2"  (1.575 m)    Body mass index is 26.8 kg/m.   General: The patient is well-developed and well-nourished and in no acute distress   Neurologic Exam  Mental status: The patient is alert and oriented x 3 at the time of the examination. The patient has apparent normal recent and remote memory, with an apparently normal attention span and concentration ability.   Speech is normal.  Cranial nerves: Extraocular movements are full.  Facial strength and sensation is normal.  The trapezius strength is normal.  No obvious hearing deficits are noted.  Motor:  Muscle bulk is normal.    Muscle tone is increased in the legs, right greater than left.  Strength is 5/5.  Sensory: Sensory testing is intact to touch and vibration in arms but reduced vibraiton in legs.   Coordination: Cerebellar testing shows good finger-nose-finger but mildly reduced heel-to-shin.  Gait and station: Station is normal.   The gait is mildly wide.   Tandem gait is moderately wide.  Romberg is borderline.  Reflexes: Deep tendon reflexes are increased bilaterally in legs.  DTRs are increased at the knees with spread.  There is no ankle clonus.  Plantar responses are normal.     Multiple sclerosis (HCC) - Plan: Stratify JCV Antibody Test ([facility]), CBC with Differential/Platelet, MR BRAIN WO CONTRAST, MR CERVICAL SPINE WO CONTRAST  High risk medication use - Plan: Stratify JCV Antibody Test ([facility]), CBC with Differential/Platelet  Abnormality of gait - Plan: MR BRAIN WO CONTRAST, MR CERVICAL SPINE WO CONTRAST  Other fatigue  Depression, unspecified depression type  Attention deficit disorder (ADD) without hyperactivity  Numbness  History of urinary anomaly   1.   Continue Tysabri.  Check JCV antibody and CBC.  We discussed continuing Tysabri as [place]ay as she is JCV antibody negative and can to doing a switch to different DMT if she converts to positive.   Check brian and cervical spine MRI to determine if any subclinical progression.   If occurring consider a switch to Ocrevus/Kesimpta. 2.   She will continue to stay active and exercise as tolerated.    3.    Continue medications including Ritalin for ADD/fatigue.   Taper off lamotrigine 4.   Return to see in 6 months or sooner if there  are new or worsening neurologic symptoms.  Briya Lookabaugh A. Epimenio Foot, MD, PhD 02/09/2020, 11:48 AM Certified in Neurology, Clinical Neurophysiology, Sleep Medicine, Pain Medicine and Neuroimaging  [facility] [address], [address] [place], [place] [address] [phone]

## 2020-02-09 NOTE — Patient Instructions (Signed)
Lamotrigine - take one half pill twice a day for one week then stop  If tingling pain worsens, let us know and we can restart

## 2020-02-10 LAB — CBC WITH DIFFERENTIAL/PLATELET
Basophils Absolute: 0.1 10*3/uL (ref 0.0–0.2)
Basos: 1 %
EOS (ABSOLUTE): 0.6 10*3/uL — ABNORMAL HIGH (ref 0.0–0.4)
Eos: 7 %
Hematocrit: 37.9 % (ref 34.0–46.6)
Hemoglobin: 13.3 g/dL (ref 11.1–15.9)
Immature Grans (Abs): 0.1 10*3/uL (ref 0.0–0.1)
Immature Granulocytes: 1 %
Lymphocytes Absolute: 3.5 10*3/uL — ABNORMAL HIGH (ref 0.7–3.1)
Lymphs: 39 %
MCH: 32 pg (ref 26.6–33.0)
MCHC: 35.1 g/dL (ref 31.5–35.7)
MCV: 91 fL (ref 79–97)
Monocytes Absolute: 0.6 10*3/uL (ref 0.1–0.9)
Monocytes: 7 %
NRBC: 1 % — ABNORMAL HIGH (ref 0–0)
Neutrophils Absolute: 3.9 10*3/uL (ref 1.4–7.0)
Neutrophils: 45 %
Platelets: 203 10*3/uL (ref 150–450)
RBC: 4.15 x10E6/uL (ref 3.77–5.28)
RDW: 14 % (ref 11.7–15.4)
WBC: 8.8 10*3/uL (ref 3.4–10.8)

## 2020-02-16 ENCOUNTER — Telehealth: Payer: Self-pay | Admitting: Neurology

## 2020-02-16 NOTE — Telephone Encounter (Signed)
Pt called, can Dr. Epimenio Foot, need a ankle brace. Can Dr. Epimenio Foot send a prescription for a ankle brace. Would like a call from the nurse.

## 2020-02-16 NOTE — Telephone Encounter (Signed)
Pt send mychart message, I replied to this. Awaiting her response.

## 2020-02-17 NOTE — Telephone Encounter (Signed)
JCV ab drawn on 02/09/20 negative, index: 0.09

## 2020-02-21 ENCOUNTER — Other Ambulatory Visit: Payer: Self-pay | Admitting: *Deleted

## 2020-02-21 DIAGNOSIS — M25372 Other instability, left ankle: Secondary | ICD-10-CM

## 2020-02-21 DIAGNOSIS — R531 Weakness: Secondary | ICD-10-CM

## 2020-02-22 ENCOUNTER — Other Ambulatory Visit: Payer: Self-pay

## 2020-02-22 ENCOUNTER — Ambulatory Visit
Admission: RE | Admit: 2020-02-22 | Discharge: 2020-02-22 | Disposition: A | Discharge status: Home / Self Care | Payer: Medicare Other | Source: Ambulatory Visit | Attending: Neurology | Admitting: Neurology

## 2020-02-22 DIAGNOSIS — M25372 Other instability, left ankle: Secondary | ICD-10-CM

## 2020-02-22 DIAGNOSIS — R531 Weakness: Secondary | ICD-10-CM

## 2020-02-24 ENCOUNTER — Other Ambulatory Visit: Payer: Self-pay | Admitting: *Deleted

## 2020-02-24 DIAGNOSIS — M25372 Other instability, left ankle: Secondary | ICD-10-CM

## 2020-02-24 DIAGNOSIS — R269 Unspecified abnormalities of gait and mobility: Secondary | ICD-10-CM

## 2020-02-24 DIAGNOSIS — R531 Weakness: Secondary | ICD-10-CM

## 2020-02-25 ENCOUNTER — Ambulatory Visit
Admission: RE | Admit: 2020-02-25 | Discharge: 2020-02-25 | Disposition: A | Discharge status: Home / Self Care | Payer: Medicare Other | Source: Ambulatory Visit | Attending: Neurology | Admitting: Neurology

## 2020-02-25 ENCOUNTER — Other Ambulatory Visit: Payer: Self-pay

## 2020-02-25 DIAGNOSIS — R269 Unspecified abnormalities of gait and mobility: Secondary | ICD-10-CM

## 2020-02-25 DIAGNOSIS — G35 Multiple sclerosis: Secondary | ICD-10-CM

## 2020-02-25 DIAGNOSIS — G35D Multiple sclerosis, unspecified: Secondary | ICD-10-CM

## 2020-02-28 ENCOUNTER — Other Ambulatory Visit: Payer: Self-pay | Admitting: *Deleted

## 2020-02-28 MED ORDER — METHYLPHENIDATE HCL 10 MG PO TABS: 10.0000 mg | ORAL_TABLET | Freq: Four times a day (QID) | ORAL | 0 refills | Status: DC

## 2020-03-02 ENCOUNTER — Other Ambulatory Visit: Payer: Self-pay | Admitting: Neurology

## 2020-03-02 MED ORDER — METHYLPHENIDATE HCL 10 MG PO TABS: 10.0000 mg | ORAL_TABLET | Freq: Four times a day (QID) | ORAL | 0 refills | Status: DC

## 2020-03-14 ENCOUNTER — Telehealth: Payer: Self-pay | Admitting: Neurology

## 2020-03-14 NOTE — Telephone Encounter (Signed)
Patient has declined Referral because she is feeling a lot better . Thanks Annabelle Harman

## 2020-04-12 ENCOUNTER — Other Ambulatory Visit: Payer: Self-pay | Admitting: Neurology

## 2020-05-02 ENCOUNTER — Telehealth: Payer: Self-pay | Admitting: *Deleted

## 2020-05-02 NOTE — Telephone Encounter (Signed)
Submitted PA on CMM. Waiting on determination from [facility] [place].

## 2020-05-02 NOTE — Telephone Encounter (Signed)
Initiated PA on CMM. Key: [id number]. Waiting on mychart response from pt and office notes to be attached prior to submitting for review.

## 2020-05-02 NOTE — Telephone Encounter (Signed)
Received notification on CMM that PA approved effective from 05/02/2020 through 05/02/2021.

## 2020-07-08 ENCOUNTER — Other Ambulatory Visit: Payer: Self-pay | Admitting: Neurology

## 2020-07-10 ENCOUNTER — Telehealth: Payer: Self-pay | Admitting: *Deleted

## 2020-07-10 NOTE — Telephone Encounter (Signed)
Faxed completed/signed Tysabri pt status report and reauth questionnaire to [facility] at [phone]. Received confirmation.   Received fax that pt re-authorized for Tysabri 07/10/2020-02/05/2021 Pt enrollment number: [id number]. Account: [employer]. Site auth number: [id number].

## 2020-07-11 ENCOUNTER — Other Ambulatory Visit: Payer: Self-pay | Admitting: Neurology

## 2020-07-24 ENCOUNTER — Other Ambulatory Visit: Payer: Self-pay | Admitting: Neurology

## 2020-08-09 ENCOUNTER — Other Ambulatory Visit: Payer: Self-pay | Admitting: Neurology

## 2020-08-09 ENCOUNTER — Encounter: Payer: Self-pay | Admitting: Neurology

## 2020-08-09 DIAGNOSIS — G35 Multiple sclerosis: Secondary | ICD-10-CM

## 2020-08-10 LAB — CBC WITH DIFFERENTIAL/PLATELET
Basophils Absolute: 0.1 10*3/uL (ref 0.0–0.2)
Basos: 1 %
EOS (ABSOLUTE): 0.5 10*3/uL — ABNORMAL HIGH (ref 0.0–0.4)
Eos: 5 %
Hematocrit: 39 % (ref 34.0–46.6)
Hemoglobin: 13.5 g/dL (ref 11.1–15.9)
Immature Grans (Abs): 0.1 10*3/uL (ref 0.0–0.1)
Immature Granulocytes: 1 %
Lymphocytes Absolute: 2.9 10*3/uL (ref 0.7–3.1)
Lymphs: 33 %
MCH: 32.4 pg (ref 26.6–33.0)
MCHC: 34.6 g/dL (ref 31.5–35.7)
MCV: 94 fL (ref 79–97)
Monocytes Absolute: 0.7 10*3/uL (ref 0.1–0.9)
Monocytes: 7 %
NRBC: 1 % — ABNORMAL HIGH (ref 0–0)
Neutrophils Absolute: 4.8 10*3/uL (ref 1.4–7.0)
Neutrophils: 53 %
Platelets: 181 10*3/uL (ref 150–450)
RBC: 4.17 x10E6/uL (ref 3.77–5.28)
RDW: 14.9 % (ref 11.7–15.4)
WBC: 9 10*3/uL (ref 3.4–10.8)

## 2020-08-16 ENCOUNTER — Ambulatory Visit (INDEPENDENT_AMBULATORY_CARE_PROVIDER_SITE_OTHER): Payer: Medicare Other | Admitting: Neurology

## 2020-08-16 ENCOUNTER — Telehealth: Payer: Self-pay | Admitting: *Deleted

## 2020-08-16 ENCOUNTER — Encounter: Payer: Self-pay | Admitting: Neurology

## 2020-08-16 VITALS — BP 136/78 | HR 82 | Ht 62.0 in | Wt 147.0 lb

## 2020-08-16 DIAGNOSIS — R269 Unspecified abnormalities of gait and mobility: Secondary | ICD-10-CM | POA: Diagnosis not present

## 2020-08-16 DIAGNOSIS — R200 Anesthesia of skin: Secondary | ICD-10-CM

## 2020-08-16 DIAGNOSIS — F988 Other specified behavioral and emotional disorders with onset usually occurring in childhood and adolescence: Secondary | ICD-10-CM | POA: Diagnosis not present

## 2020-08-16 DIAGNOSIS — R5383 Other fatigue: Secondary | ICD-10-CM

## 2020-08-16 DIAGNOSIS — G35 Multiple sclerosis: Secondary | ICD-10-CM

## 2020-08-16 DIAGNOSIS — G35D Multiple sclerosis, unspecified: Secondary | ICD-10-CM

## 2020-08-16 DIAGNOSIS — F32A Depression, unspecified: Secondary | ICD-10-CM | POA: Diagnosis not present

## 2020-08-16 DIAGNOSIS — Z79899 Other long term (current) drug therapy: Secondary | ICD-10-CM

## 2020-08-16 MED ORDER — CIPROFLOXACIN HCL 250 MG PO TABS: 250.0000 mg | ORAL_TABLET | Freq: Every day | ORAL | 0 refills | Status: AC | PRN

## 2020-08-16 NOTE — Telephone Encounter (Signed)
Called [facility] and spoke w/ Leotis Shames. JCV ab drawn on 08/09/20 negative, index: 0.13. She will fax copy of final report to [phone].  I received report and gave to MD for review.

## 2020-08-16 NOTE — Progress Notes (Signed)
Called [facility] and spoke w/ Leotis Shames. JCV ab drawn on 08/09/20 negative, index: 0.13. She will fax copy of final report to [phone].

## 2020-08-16 NOTE — Progress Notes (Signed)
[facility]  PATIENT: Kellie [name] DOB: Sep 21, 1960  REFERRING CLINICIAN: Vernona Rieger HISTORY FROM: Patient  REASON FOR VISIT: Multiple Sclerosis   HISTORICAL  CHIEF COMPLAINT:  Chief Complaint  Patient presents with  . Follow-up    RM 13. Last seen 02/09/2020. On Tysabri for MS. Last infusion: 08/09/2020, next: 09/07/20. Last JCV 08/09/20 negative, index: 0.13.Reports she has not been feeling well since having COVID back in January. Reports weakness has worsened. 3 falls in the last month.     HISTORY OF PRESENT ILLNESS:  Kellie [name] is a 60 y.o. woman with relapsing remitting MS diagnoses in 1997.   Update 08/16/20: She feels her MS is stable and she continues on Tysabri.    Her last JCV Ab was 08/09/2020 (0.13 negative).    She reports her PCP was concerned about high lymphocytes.   They were normal 08/09/2020.   We discussed that many patients on Tysabri will have mildly elevated lymphocyte counts.    She continues to have falls, mostly due to her left leg and ankle weakness.   She has a left ankle brace.    She has some dysesthesias.   Gabapentin and lamotrigine help the dysesthesias.     She has urinary hesitancy and self caths up to 10 times a day.   For spasms she takes Myrbetriq, oxybutynin.  At night she takes desmopressin.   Due to frequent UTI, she is on nitrofurantoin.  She occasionally uses Cipro.    Mood is worse   She is sad after losing her 8 yo dog recently.  She is on Effexor and lamotrigine.  She is using more Xanax.   Fatigue and attention deficit are better with Ritalin.    She takes valium 10 mg nightly and is sleeping well now.   She has osteoporosis (last checked in 05/2019).   She stopped alendronate a couple years ago.   She fractured her hip in 2011.   She was on Forteo but it is too expensive  She has a patient assitance form she has not yet filled out.    She has her Covid 19 vaccinations.  She had Covid-19 Omicron in January 2022.  She  is not able to participate as well in [place] as she is much more fatigued     MS HISTORY:   She presented with left sided numbness in September 1997.   She underwent an MRI and a lumbar puncture and was told MS was unlikely.   She had right sided numbness in January 1998 and had brain and spine MRI.    There were changes suggestive of MS and she was diagnosed in January 1998.   She was started on Betaseron.   She was on Betaseron x 10 years and then switched to Avonex for a year due to tolerability issues.   She had clinical exacerbations and MRI changes and then went on Tysabri in 2007 or 2008 and has had 92 infusions, her last infusion being 03/02/15.    She is transferring care from [facility] to [facility].  She tolerates Tysabri well.   She is JCV Ab negative.     An MRI of the spinal cord dated 02/02/2013 shows demyelinating plaque to the left adjacent to C3, anteriorly adjacent to C4, to the right adjacent to C5-C6, posteriorly to the right adjacent to C6-C7. When that study was compared to an MRI dated 12/29/2006, there was no definite interval change. MRI of the brain the same day was  abnormal showing signal changes compatible with the diagnosis of multiple sclerosis. Compared to the prior study dated 02/23/2012, there was no definite interval change.      MRI brain and cervical spine (09/2014) showing 4 cervical spine foci and typical demyelination plaques in brain (brain unchanged compared to 2012)..    She had MRI 05/2016 after a fall.  Repeat MRI of the brain and cervical spine 03/20/2017 did not show any new lesions compared to the 2016 MRIs.  STUDIES MRI brain 02/25/20 showed T2 hyperintense foci within the hemispheres and cerebellum in a pattern and configuration consistent with chronic demyelinating plaque associated with multiple sclerosis.  None of the foci appear to be acute.  Compared to the MRI dated 03/20/2017, there are no new lesions.  MRI cervical spine 02/25/2020 showed  multiple T2 hyperintense foci within the spinal cord consistent with chronic demyelination associated with multiple sclerosis.  None of the foci appear to be acute and there were no new lesions compared to the 03/20/2017 MRI.    Multilevel degenerative changes    There is no spinal stenosis.  Foraminal narrowing is noted at several levels but there does not appear to be nerve root compression.  MRI cervical spine 03/21/2017 showed five T2 hyperintense foci within the spinal cord consistent with chronic demyelinating plaque associated with multiple sclerosis. None of these appear to be acute and they did not enhance. When compared to the MRI dated 08/04/2014, there is no interval change.    Multilevel degenerative changes as detailed above. The degenerative changes at C6-C7 have progressed when compared to the previous MRI.  There does not appear to be any definite nerve root compression and there is no spinal stenosis or spinal cord compression.  MRI brain 03/20/2017 multiple T2/FLAIR hyperintense foci in the white matter of the hemispheres and cerebellum in a pattern and configuration consistent with chronic demyelinating plaque associated with multiple sclerosis. None of the foci appears to be acute. When compared to the MRI dated 08/17/2014, there is no interim change.     There is a normal enhancement pattern and there are no acute findings.   Bone density 06/02/2019 showed The BMD measured at AP Spine L1-L3 is 0.808 g/cm2 with a T-score of -3.0.   Patient is considered OSTEOPOROTIC according to World Health Organization [facility]) criteria.  REVIEW OF SYSTEMS:  Constitutional: No fevers, chills, sweats, or change in appetite.   Notes fatigue and some insomnia Eyes: No visual changes, double vision, eye pain Ear, nose and throat: No hearing loss, ear pain, nasal congestion, sore throat Cardiovascular: No chest pain, palpitations Respiratory:  No shortness of breath at rest or with exertion.   No  wheezes GastrointestinaI: No nausea, vomiting, diarrhea, abdominal pain.   She has dysphagia Genitourinary: as above. Musculoskeletal:  as above iiIntegumentary: No rash, pruritus, skin lesions Neurological: as above Psychiatric: Mild depression at this time.  Mild anxiety Endocrine: No palpitations, diaphoresis, change in appetite, change in weigh or increased thirst Hematologic/Lymphatic:  No anemia, purpura, petechiae. Allergic/Immunologic: No itchy/runny eyes, nasal congestion, recent allergic reactions, rashes  ALLERGIES: Allergies  Allergen Reactions  . Erythromycin Nausea And Vomiting  . Clindamycin/Lincomycin   . Dilaudid [Hydromorphone Hcl] Other (See Comments)    Hallucinations  . Sulfa Antibiotics     HOME MEDICATIONS:  Current Outpatient Medications:  .  ALPRAZolam (XANAX) 0.25 MG tablet, TAKE 1 TABLET BY MOUTH AT BEDTIME AS NEEDED FOR ANXIETY, Disp: 90 tablet, Rfl: 1 .  Ascorbic Acid (VITAMIN C PO),  Take 500 mg by mouth daily., Disp: , Rfl:  .  Calcium Carbonate-Vitamin D 600-125 MG-UNIT TABS, Take by mouth., Disp: , Rfl:  .  cetirizine (ZYRTEC) 1 MG/ML syrup, Take 5 mg by mouth., Disp: , Rfl:  .  Cholecalciferol (VITAMIN D3 PO), Take 1,000 Units by mouth daily., Disp: , Rfl:  .  desmopressin (DDAVP) 0.1 MG tablet, TAKE 1 TABLET BY MOUTH EVERY DAY, Disp: 90 tablet, Rfl: 3 .  diazepam (VALIUM) 5 MG tablet, TAKE ONE TABLET BY MOUTH TWO TIMES DAILY AS NEEDED (Patient taking differently: Take 2 tablets at bedtime), Disp: 180 tablet, Rfl: 1 .  famciclovir (FAMVIR) 250 MG tablet, TAKE 1 TABLET BY MOUTH TWICE A DAY, Disp: , Rfl:  .  Fish Oil-Cholecalciferol (FISH OIL + D3) 1000-1000 MG-UNIT CAPS, Take 3,600 mg by mouth daily. , Disp: , Rfl:  .  gabapentin (NEURONTIN) 600 MG tablet, TAKE 1 TO 2 TABLETS BY MOUTH DAILY, Disp: 180 tablet, Rfl: 1 .  lamoTRIgine (LAMICTAL) 100 MG tablet, TAKE 1 TABLET BY MOUTH TWICE A DAY, Disp: 180 tablet, Rfl: 3 .  methylphenidate (RITALIN) 10 MG  tablet, Take 1 tablet (10 mg total) by mouth 4 (four) times daily. Takes 2 tablets in the morning and 2 tablets midday, Disp: 360 tablet, Rfl: 0 .  MYRBETRIQ 50 MG TB24 tablet, TAKE 1 TABLET ONCE DAILY. (Patient taking differently: Take 50 mg by mouth 2 (two) times daily.), Disp: 30 tablet, Rfl: 6 .  natalizumab (TYSABRI) 300 MG/15ML injection, Inject into the vein., Disp: , Rfl:  .  natalizumab 300 mg in sodium chloride 0.9 % 100 mL, Inject into the vein., Disp: , Rfl:  .  nitrofurantoin (MACRODANTIN) 100 MG capsule, Take 1 capsule (100 mg total) by mouth at bedtime., Disp: 90 capsule, Rfl: 0 .  omeprazole (PRILOSEC) 20 MG capsule, 2 (two) times daily., Disp: , Rfl:  .  oxybutynin (DITROPAN-XL) 10 MG 24 hr tablet, TAKE 1 TABLET BY MOUTH DAILY. AS DIRECTED, Disp: 90 tablet, Rfl: 0 .  simvastatin (ZOCOR) 40 MG tablet, TAKE 1 TABLET AT BEDTIME FOR CHOLESTEROL, Disp: , Rfl:  .  venlafaxine XR (EFFEXOR-XR) 150 MG 24 hr capsule, TAKE 2 CAPSULES (300 MG TOTAL) BY MOUTH DAILY., Disp: 180 capsule, Rfl: 3 .  vitamin B-12 (CYANOCOBALAMIN) 1000 MCG tablet, Take 2,500 mcg by mouth daily. , Disp: , Rfl:  .  Wheat Dextrin (BENEFIBER) POWD, Take 1 Dose by mouth every morning., Disp: , Rfl:  .  ciprofloxacin (CIPRO) 250 MG tablet, Take 1 tablet (250 mg total) by mouth daily as needed., Disp: 14 tablet, Rfl: 0   PAST MEDICAL HISTORY: Past Medical History:  Diagnosis Date  . Chronic kidney disease   . Complication of anesthesia    pt states she got thrush after anesthesia  . Multiple sclerosis (HCC)   . Neuropathy   . Vision abnormalities     PAST SURGICAL HISTORY: Past Surgical History:  Procedure Laterality Date  . CARPAL TUNNEL RELEASE Right    2010  . CARPAL TUNNEL RELEASE Left   . CESAREAN SECTION    . COLONOSCOPY  07/26/14  . ENDOMETRIAL ABLATION     2007  . HIP FRACTURE SURGERY    . hysterctomy    . TUBAL LIGATION      FAMILY HISTORY: Family History  Problem Relation Age of Onset  .  Lung cancer Mother   . Dementia Father   . Diverticulitis Father   . Pancreatitis Father   . Breast cancer  Cousin     SOCIAL HISTORY:  Social History   Socioeconomic History  . Marital status: Significant Other    Spouse name: Not on file  . Number of children: Not on file  . Years of education: Not on file  . Highest education level: Not on file  Occupational History  . Not on file  Tobacco Use  . Smoking status: Former Smoker    Quit date: 03/29/1981    Years since quitting: 39.4  . Smokeless tobacco: Never Used  Substance and Sexual Activity  . Alcohol use: Yes    Alcohol/week: 0.0 standard drinks    Comment: Occasional  . Drug use: No  . Sexual activity: Not on file  Other Topics Concern  . Not on file  Social History Narrative  . Not on file   Social Determinants of Health   Financial Resource Strain: Not on file  Food Insecurity: Not on file  Transportation Needs: Not on file  Physical Activity: Not on file  Stress: Not on file  Social Connections: Not on file  Intimate Partner Violence: Not on file     PHYSICAL EXAM from 02/12/2018  Vitals:   08/16/20 1046  BP: 136/78  Pulse: 82  Weight: 147 lb (66.7 kg)  Height: 5\' 2"  (1.575 m)    Body mass index is 26.89 kg/m.   General: The patient is well-developed and well-nourished and in no acute distress   Neurologic Exam  Mental status: The patient is alert and oriented x 3 at the time of the examination. The patient has apparent normal recent and remote memory, with an apparently normal attention span and concentration ability.   Speech is normal.  Cranial nerves: Extraocular movements are full.  Facial strength and sensation is normal.  The trapezius strength is normal.  No obvious hearing deficits are noted.  Motor:  Muscle bulk is normal.    Muscle tone is increased in the legs, right greater than left.  Strength is 5/5.  Sensory: Sensory testing is intact to touch and vibration in arms but  reduced vibraiton in legs, left worse than right .   Coordination: Cerebellar testing shows good finger-nose-finger but mildly reduced heel-to-shin.  Gait and station: Station is normal.   Her gait is mildly wide.  The tandem gait is moderately wide.  Romberg is negative.  Reflexes: Deep tendon reflexes are increased bilaterally in legs.  DTRs are increased at the knees with spread.  There is no ankle clonus.  Plantar responses are normal.     Multiple sclerosis (HCC)  Abnormality of gait  Attention deficit disorder (ADD) without hyperactivity  Depression, unspecified depression type  High risk medication use  Other fatigue  Numbness   1.   Continue Tysabri.  Check JCV antibody and CBC.  We discussed continuing Tysabri as St[place] as she is JCV antibody negative and can to doing a switch to different DMT if she converts to positive.   Check brian and cervical spine MRI to determine if any subclinical progression.   If occurring consider a switch to Ocrevus/Kesimpta. 2.   She will continue to stay active and exercise as tolerated.    3.    Continue medications including Ritalin for ADD/fatigue.   Taper off lamotrigine 4.   Return to see in 6 months or sooner if there are new or worsening neurologic symptoms.  Khoury Siemon A. [place], MD, PhD 08/16/2020, 8:02 PM Certified in Neurology, Clinical Neurophysiology, Sleep Medicine, Pain Medicine and Neuroimaging  [facility] [address], [place], [place] [address] [phone]

## 2020-08-22 ENCOUNTER — Other Ambulatory Visit: Payer: Self-pay | Admitting: Internal Medicine

## 2020-08-22 DIAGNOSIS — Z1231 Encounter for screening mammogram for malignant neoplasm of breast: Secondary | ICD-10-CM

## 2020-08-24 ENCOUNTER — Ambulatory Visit
Admission: RE | Admit: 2020-08-24 | Discharge: 2020-08-24 | Disposition: A | Discharge status: Home / Self Care | Payer: Medicare Other | Source: Ambulatory Visit | Attending: Internal Medicine | Admitting: Internal Medicine

## 2020-08-24 ENCOUNTER — Other Ambulatory Visit: Payer: Self-pay

## 2020-08-24 DIAGNOSIS — Z1231 Encounter for screening mammogram for malignant neoplasm of breast: Secondary | ICD-10-CM

## 2020-09-06 ENCOUNTER — Other Ambulatory Visit: Payer: Self-pay | Admitting: *Deleted

## 2020-09-06 ENCOUNTER — Other Ambulatory Visit: Payer: Self-pay | Admitting: Neurology

## 2020-09-06 MED ORDER — METHYLPHENIDATE HCL 10 MG PO TABS: 10.0000 mg | ORAL_TABLET | Freq: Four times a day (QID) | ORAL | 0 refills | Status: DC

## 2020-09-07 ENCOUNTER — Telehealth: Payer: Self-pay | Admitting: *Deleted

## 2020-09-07 NOTE — Telephone Encounter (Signed)
Submitted PA methylphenidate on CMM. Key: [id number]. Waiting on determination from [facility] [place] Medicare Part D.  Patient ID: [id number]

## 2020-09-12 NOTE — Telephone Encounter (Signed)
PA approved 09/07/20 until 09/07/21

## 2020-10-04 ENCOUNTER — Telehealth: Payer: Self-pay | Admitting: Neurology

## 2020-10-04 ENCOUNTER — Other Ambulatory Visit: Payer: Self-pay | Admitting: *Deleted

## 2020-10-04 MED ORDER — METHYLPHENIDATE HCL 10 MG PO TABS: 10.0000 mg | ORAL_TABLET | Freq: Four times a day (QID) | ORAL | 0 refills | Status: DC

## 2020-10-04 NOTE — Telephone Encounter (Signed)
Kellie [name] was here today for her Tysabri infusion.  At the end of the infusion, blood pressure was elevated at 182/83 and then 5 minutes later was 169/84.  (initial blood pressure was 155/84).  She felt poorly but had no specific complaints.  She had had increased blood pressure a few other times during her infusions.  I had her receive hydralazine 10 mg IV x2.  Follow-up blood pressure upon discharge from the office was 153/74.  About 30 minutes later, she came back telling came in the infusion suite that she was not feeling good.  A repeat blood pressure was 129/63.  I had advised her to speak to her primary care physician about going on a blood pressure medication.  She reports that her BP is not elevated doing her primary care visits.  I confirmed that that was true at her last visit..  I will write a standing order for the infusion service to treat with hydralazine for elevated BP in the future.

## 2020-10-05 ENCOUNTER — Other Ambulatory Visit: Payer: Self-pay

## 2020-10-05 MED ORDER — LOSARTAN POTASSIUM 50 MG PO TABS
50.0000 mg | ORAL_TABLET | Freq: Every day | ORAL | 5 refills | Status: DC
Start: 2020-10-05 — End: 2021-04-04

## 2020-11-28 ENCOUNTER — Other Ambulatory Visit: Payer: Self-pay | Admitting: Neurology

## 2020-11-29 ENCOUNTER — Other Ambulatory Visit: Payer: Self-pay | Admitting: Neurology

## 2020-11-29 MED ORDER — LAMOTRIGINE 100 MG PO TABS: 100.0000 mg | ORAL_TABLET | Freq: Two times a day (BID) | ORAL | 3 refills | Status: DC

## 2021-01-23 ENCOUNTER — Telehealth: Payer: Self-pay | Admitting: Neurology

## 2021-01-23 NOTE — Telephone Encounter (Signed)
Replied to pt mychart message. Confirmed she received message.

## 2021-01-23 NOTE — Telephone Encounter (Signed)
Pt called had Root Canal done on yesterday however she is in lots of pain. They offered to give her a steroid shot in her mouth today. She is supposed get her Tysabri on tomorrow, pt wanting to know would this be okay to do. She needs to know before 12 today. Pt requesting a call back.

## 2021-01-29 ENCOUNTER — Other Ambulatory Visit: Payer: Self-pay | Admitting: Neurology

## 2021-02-20 ENCOUNTER — Encounter: Payer: Self-pay | Admitting: Neurology

## 2021-02-20 ENCOUNTER — Ambulatory Visit: Payer: Medicare Other | Admitting: Neurology

## 2021-02-20 ENCOUNTER — Telehealth: Payer: Self-pay | Admitting: Neurology

## 2021-02-20 VITALS — BP 142/81 | HR 72 | Ht 62.0 in | Wt 143.5 lb

## 2021-02-20 DIAGNOSIS — R5383 Other fatigue: Secondary | ICD-10-CM

## 2021-02-20 DIAGNOSIS — R269 Unspecified abnormalities of gait and mobility: Secondary | ICD-10-CM | POA: Diagnosis not present

## 2021-02-20 DIAGNOSIS — G35 Multiple sclerosis: Secondary | ICD-10-CM

## 2021-02-20 DIAGNOSIS — F988 Other specified behavioral and emotional disorders with onset usually occurring in childhood and adolescence: Secondary | ICD-10-CM | POA: Diagnosis not present

## 2021-02-20 DIAGNOSIS — F32A Depression, unspecified: Secondary | ICD-10-CM | POA: Diagnosis not present

## 2021-02-20 DIAGNOSIS — Z79899 Other long term (current) drug therapy: Secondary | ICD-10-CM

## 2021-02-20 DIAGNOSIS — R200 Anesthesia of skin: Secondary | ICD-10-CM

## 2021-02-20 MED ORDER — GABAPENTIN 600 MG PO TABS: ORAL_TABLET | ORAL | 3 refills | Status: DC

## 2021-02-20 MED ORDER — METHYLPHENIDATE HCL 10 MG PO TABS: ORAL_TABLET | ORAL | 0 refills | Status: DC

## 2021-02-20 NOTE — Progress Notes (Signed)
[facility]  PATIENT: Kellie [name] DOB: [birth year]  REFERRING CLINICIAN: Vernona Rieger HISTORY FROM: Patient  REASON FOR VISIT: Multiple Sclerosis   HISTORICAL  CHIEF COMPLAINT:  Chief Complaint  Patient presents with   Follow-up    Room 2. Pt alone. MS follow up, patient is on Tysabri, infusion scheduled for tomorrow.     HISTORY OF PRESENT ILLNESS:  Kellie [name] is a 60 y.o. woman with relapsing remitting MS diagnoses in 1997.   Update 02/20/2021: She feels her MS is stable and she continues on Tysabri.    Her last JCV Ab was 08/09/2020 (0.13 negative).       She continues to have falls, mostly due to her left leg and ankle weakness.   She has a left ankle brace.  She has weakness in both legs.   She has some dysesthesias.   Gabapentin and lamotrigine help the dysesthesias.     She has urinary hesitancy and self caths up to 10 times a day.   For spasms she takes Myrbetriq, oxybutynin.  At night she takes desmopressin which helps her reduce the frequency of catheterization so she can sleep better.   Due to frequent UTI, she is on nitrofurantoin.     She has left knee pain and has had fluid drained, steroid injections and Synvisc(or similar).   She is trying to avoid surgery.    She wears a brace on that side.  Mood is better since getting a puppy (she had lost her dog of 13 years).  She is on Effexor and lamotrigine.   Fatigue and attention deficit are better with Ritalin.    She takes valium 10 mg nightly and is sleeping well now.   She has osteoporosis (last checked in 05/2019).   She stopped alendronate a couple years ago.   She fractured her hip in 2011.     She has her Covid 19 vaccinations.  She had Covid-19 Omicron in January 2022.  She feels she never go to her baseline.   She notes having more difficulty with Jazzercise so taking fewer classes now.        MS HISTORY:   She presented with left sided numbness in September 1997.   She underwent an MRI and  a lumbar puncture and was told MS was unlikely.   She had right sided numbness in January 1998 and had brain and spine MRI.    There were changes suggestive of MS and she was diagnosed in January 1998.   She was started on Betaseron.   She was on Betaseron x 10 years and then switched to Avonex for a year due to tolerability issues.   She had clinical exacerbations and MRI changes and then went on Tysabri in 2007 or 2008 and has had 92 infusions, her last infusion being 03/02/15.    She is transferring care from [facility] to [facility].  She tolerates Tysabri well.   She is JCV Ab negative.     An MRI of the spinal cord dated 02/02/2013 shows demyelinating plaque to the left adjacent to C3, anteriorly adjacent to C4, to the right adjacent to C5-C6, posteriorly to the right adjacent to C6-C7. When that study was compared to an MRI dated 12/29/2006, there was no definite interval change. MRI of the brain the same day was abnormal showing signal changes compatible with the diagnosis of multiple sclerosis. Compared to the prior study dated 02/23/2012, there was no definite interval change.  MRI brain and cervical spine (09/2014) showing 4 cervical spine foci and typical demyelination plaques in brain (brain unchanged compared to 2012)..    She had MRI 05/2016 after a fall.  Repeat MRI of the brain and cervical spine 03/20/2017 did not show any new lesions compared to the 2016 MRIs.  STUDIES MRI brain 02/25/20 showed T2 hyperintense foci within the hemispheres and cerebellum in a pattern and configuration consistent with chronic demyelinating plaque associated with multiple sclerosis.  None of the foci appear to be acute.  Compared to the MRI dated 03/20/2017, there are no new lesions.  MRI cervical spine 02/25/2020 showed multiple T2 hyperintense foci within the spinal cord consistent with chronic demyelination associated with multiple sclerosis.  None of the foci appear to be acute and there were no new  lesions compared to the 03/20/2017 MRI.    Multilevel degenerative changes    There is no spinal stenosis.  Foraminal narrowing is noted at several levels but there does not appear to be nerve root compression.  MRI cervical spine 03/21/2017 showed five T2 hyperintense foci within the spinal cord consistent with chronic demyelinating plaque associated with multiple sclerosis. None of these appear to be acute and they did not enhance. When compared to the MRI dated 08/04/2014, there is no interval change.    Multilevel degenerative changes as detailed above. The degenerative changes at C6-C7 have progressed when compared to the previous MRI.  There does not appear to be any definite nerve root compression and there is no spinal stenosis or spinal cord compression.  MRI brain 03/20/2017 multiple T2/FLAIR hyperintense foci in the white matter of the hemispheres and cerebellum in a pattern and configuration consistent with chronic demyelinating plaque associated with multiple sclerosis. None of the foci appears to be acute. When compared to the MRI dated 08/17/2014, there is no interim change.     There is a normal enhancement pattern and there are no acute findings.   Bone density 06/02/2019 showed The BMD measured at [facility] Spine L1-L3 is 0.808 g/cm2 with a T-score of -3.0.   Patient is considered OSTEOPOROTIC according to World Health Organization [facility]) criteria.  REVIEW OF SYSTEMS:  Constitutional: No fevers, chills, sweats, or change in appetite.   Notes fatigue and some insomnia Eyes: No visual changes, double vision, eye pain Ear, nose and throat: No hearing loss, ear pain, nasal congestion, sore throat Cardiovascular: No chest pain, palpitations Respiratory:  No shortness of breath at rest or with exertion.   No wheezes GastrointestinaI: No nausea, vomiting, diarrhea, abdominal pain.   She has dysphagia Genitourinary: as above. Musculoskeletal:  as above iiIntegumentary: No rash, pruritus, skin  lesions Neurological: as above Psychiatric: Mild depression at this time.  Mild anxiety Endocrine: No palpitations, diaphoresis, change in appetite, change in weigh or increased thirst Hematologic/Lymphatic:  No anemia, purpura, petechiae. Allergic/Immunologic: No itchy/runny eyes, nasal congestion, recent allergic reactions, rashes  ALLERGIES: Allergies  Allergen Reactions   Erythromycin Nausea And Vomiting   Clindamycin/Lincomycin    Dilaudid [Hydromorphone Hcl] Other (See Comments)    Hallucinations   Sulfa Antibiotics     HOME MEDICATIONS:  Current Outpatient Medications:    ALPRAZolam (XANAX) 0.25 MG tablet, TAKE 1 TABLET BY MOUTH AT BEDTIME AS NEEDED FOR ANXIETY, Disp: 90 tablet, Rfl: 1   Ascorbic Acid (VITAMIN C PO), Take 500 mg by mouth daily., Disp: , Rfl:    Calcium Carbonate-Vitamin D 600-125 MG-UNIT TABS, Take by mouth., Disp: , Rfl:    cetirizine (ZYRTEC)  1 MG/ML syrup, Take 5 mg by mouth., Disp: , Rfl:    Cholecalciferol (VITAMIN D3 PO), Take 1,000 Units by mouth daily., Disp: , Rfl:    ciprofloxacin (CIPRO) 250 MG tablet, Take 1 tablet (250 mg total) by mouth daily as needed., Disp: 14 tablet, Rfl: 0   desmopressin (DDAVP) 0.1 MG tablet, TAKE 1 TABLET BY MOUTH EVERY DAY, Disp: 90 tablet, Rfl: 3   diazepam (VALIUM) 5 MG tablet, TAKE ONE TABLET BY MOUTH TWO TIMES DAILY AS NEEDED (Patient taking differently: Take 2 tablets at bedtime), Disp: 180 tablet, Rfl: 1   famciclovir (FAMVIR) 250 MG tablet, TAKE 1 TABLET BY MOUTH TWICE A DAY, Disp: , Rfl:    Fish Oil-Cholecalciferol (FISH OIL + D3) 1000-1000 MG-UNIT CAPS, Take 3,600 mg by mouth daily. , Disp: , Rfl:    gabapentin (NEURONTIN) 600 MG tablet, TAKE 1 TO 2 TABLETS BY MOUTH DAILY, Disp: 180 tablet, Rfl: 1   lamoTRIgine (LAMICTAL) 100 MG tablet, Take 1 tablet (100 mg total) by mouth 2 (two) times daily., Disp: 180 tablet, Rfl: 3   losartan (COZAAR) 50 MG tablet, Take 1 tablet (50 mg total) by mouth daily., Disp: 30 tablet,  Rfl: 5   methylphenidate (RITALIN) 10 MG tablet, Take 1 tablet (10 mg total) by mouth 4 (four) times daily. Takes 2 tablets in the morning and 2 tablets midday, Disp: 360 tablet, Rfl: 0   MYRBETRIQ 50 MG TB24 tablet, TAKE 1 TABLET ONCE DAILY. (Patient taking differently: Take 50 mg by mouth 2 (two) times daily.), Disp: 30 tablet, Rfl: 6   natalizumab (TYSABRI) 300 MG/15ML injection, Inject into the vein., Disp: , Rfl:    natalizumab 300 mg in sodium chloride 0.9 % 100 mL, Inject into the vein., Disp: , Rfl:    nitrofurantoin (MACRODANTIN) 100 MG capsule, Take 1 capsule (100 mg total) by mouth at bedtime., Disp: 90 capsule, Rfl: 0   omeprazole (PRILOSEC) 20 MG capsule, 2 (two) times daily., Disp: , Rfl:    oxybutynin (DITROPAN-XL) 10 MG 24 hr tablet, TAKE 1 TABLET BY MOUTH DAILY. AS DIRECTED, Disp: 90 tablet, Rfl: 0   simvastatin (ZOCOR) 40 MG tablet, TAKE 1 TABLET AT BEDTIME FOR CHOLESTEROL, Disp: , Rfl:    Teriparatide, Recombinant, (FORTEO) 600 MCG/2.4ML SOPN, Inject into the skin., Disp: , Rfl:    venlafaxine XR (EFFEXOR-XR) 150 MG 24 hr capsule, TAKE 2 CAPSULES (300 MG TOTAL) BY MOUTH DAILY., Disp: 180 capsule, Rfl: 3   vitamin B-12 (CYANOCOBALAMIN) 1000 MCG tablet, Take 2,500 mcg by mouth daily. , Disp: , Rfl:    Wheat Dextrin (BENEFIBER) POWD, Take 1 Dose by mouth every morning., Disp: , Rfl:    PAST MEDICAL HISTORY: Past Medical History:  Diagnosis Date   Chronic kidney disease    Complication of anesthesia    pt states she got thrush after anesthesia   Multiple sclerosis (HCC)    Neuropathy    Vision abnormalities     PAST SURGICAL HISTORY: Past Surgical History:  Procedure Laterality Date   CARPAL TUNNEL RELEASE Right    2010   CARPAL TUNNEL RELEASE Left    CESAREAN SECTION     COLONOSCOPY  07/26/14   ENDOMETRIAL ABLATION     2007   HIP FRACTURE SURGERY     hysterctomy     TUBAL LIGATION      FAMILY HISTORY: Family History  Problem Relation Age of Onset   Lung  cancer Mother    Dementia Father  Diverticulitis Father    Pancreatitis Father    Breast cancer Cousin     SOCIAL HISTORY:  Social History   Socioeconomic History   Marital status: Significant Other    Spouse name: Not on file   Number of children: Not on file   Years of education: Not on file   Highest education level: Not on file  Occupational History   Not on file  Tobacco Use   Smoking status: Former    Types: Cigarettes    Quit date: 03/29/1981    Years since quitting: 39.9   Smokeless tobacco: Never  Substance and Sexual Activity   Alcohol use: Yes    Alcohol/week: 0.0 standard drinks    Comment: Occasional   Drug use: No   Sexual activity: Not on file  Other Topics Concern   Not on file  Social History Narrative   Not on file   Social Determinants of Health   Financial Resource Strain: Not on file  Food Insecurity: Not on file  Transportation Needs: Not on file  Physical Activity: Not on file  Stress: Not on file  Social Connections: Not on file  Intimate Partner Violence: Not on file     PHYSICAL EXAM from 02/12/2018  Vitals:   02/20/21 1121  BP: (!) 142/81  Pulse: 72  Weight: 143 lb 8 oz (65.1 kg)  Height: 5\' 2"  (1.575 m)    Body mass index is 26.25 kg/m.   General: The patient is well-developed and well-nourished and in no acute distress   Neurologic Exam  Mental status: The patient is alert and oriented x 3 at the time of the examination. The patient has apparent normal recent and remote memory, with an apparently normal attention span and concentration ability.   Speech is normal.  Cranial nerves: Extraocular movements are full.  Facial strength and sensation is normal.  Hearing seemed normal.   Motor:  Muscle bulk is normal.    Muscle tone is increased in the legs, right greater than left.  Strength is 5/5.  Sensory: Se has intact touch and vibration in arms and reduced vibraiton in legs, left worse than right .   Coordination:  Cerebellar testing shows good finger-nose-finger but mildly reduced heel-to-shin.  Gait and station: Station is normal.   She has a mildly wide gait.  Tandem is wide.  She does not need support.  Romberg is negative.  Reflexes: Deep tendon reflexes are increased bilaterally in legs.  DTRs are increased at the knees with spread.  There is no ankle clonus.  Plantar responses are normal.     Multiple sclerosis (HCC)  Abnormality of gait  Attention deficit disorder (ADD) without hyperactivity  Depression, unspecified depression type  High risk medication use  Numbness  Other fatigue   1.   Continue Tysabri.  Check JCV antibody and CBC.  We discussed continuing Tysabri as Stitzer as she is JCV antibody negative and can to doing a switch to different DMT if she converts to positive.     2.   She will continue to stay active and exercise as tolerated.    3.    Continue medications including Ritalin for ADD/fatigue.  4.   Return to see in 6 months or sooner if there are new or worsening neurologic symptoms.  Kiko Ripp A. [place], MD, PhD 02/20/2021, 11:52 AM Certified in Neurology, Clinical Neurophysiology, Sleep Medicine, Pain Medicine and Neuroimaging  [facility] [address] [place], [place] [phone])  [phone] ° ° °

## 2021-02-20 NOTE — Telephone Encounter (Signed)
Placed JCV lab in [facility] lock box for routine lab pick up. Results pending. 

## 2021-02-21 LAB — CBC WITH DIFFERENTIAL/PLATELET
Basophils Absolute: 0.1 10*3/uL (ref 0.0–0.2)
Basos: 1 %
EOS (ABSOLUTE): 0.5 10*3/uL — ABNORMAL HIGH (ref 0.0–0.4)
Eos: 6 %
Hematocrit: 34.5 % (ref 34.0–46.6)
Hemoglobin: 12.4 g/dL (ref 11.1–15.9)
Immature Grans (Abs): 0 10*3/uL (ref 0.0–0.1)
Immature Granulocytes: 0 %
Lymphocytes Absolute: 3.3 10*3/uL — ABNORMAL HIGH (ref 0.7–3.1)
Lymphs: 46 %
MCH: 31.1 pg (ref 26.6–33.0)
MCHC: 35.9 g/dL — ABNORMAL HIGH (ref 31.5–35.7)
MCV: 87 fL (ref 79–97)
Monocytes Absolute: 0.6 10*3/uL (ref 0.1–0.9)
Monocytes: 8 %
NRBC: 1 % — ABNORMAL HIGH (ref 0–0)
Neutrophils Absolute: 2.9 10*3/uL (ref 1.4–7.0)
Neutrophils: 39 %
Platelets: 195 10*3/uL (ref 150–450)
RBC: 3.99 x10E6/uL (ref 3.77–5.28)
RDW: 13.4 % (ref 11.7–15.4)
WBC: 7.3 10*3/uL (ref 3.4–10.8)

## 2021-02-28 NOTE — Telephone Encounter (Signed)
JCV ab drawn on 02/20/21 negative, index: 0.12.

## 2021-04-04 ENCOUNTER — Other Ambulatory Visit: Payer: Self-pay | Admitting: Neurology

## 2021-04-15 ENCOUNTER — Other Ambulatory Visit: Payer: Self-pay | Admitting: Neurology

## 2021-04-16 NOTE — Telephone Encounter (Signed)
Received refill request for diazepam.  Last OV was on 02/20/21.  Next OV is scheduled for 08/28/24 .  Last RX was written on 11/21/20 for 180 tabs.   [place] [facility] has been reviewed.

## 2021-06-03 ENCOUNTER — Other Ambulatory Visit: Payer: Self-pay | Admitting: Neurology

## 2021-07-12 ENCOUNTER — Encounter: Payer: Self-pay | Admitting: Neurology

## 2021-07-24 ENCOUNTER — Encounter: Payer: Self-pay | Admitting: *Deleted

## 2021-07-24 ENCOUNTER — Other Ambulatory Visit: Payer: Self-pay | Admitting: *Deleted

## 2021-07-24 MED ORDER — VENLAFAXINE HCL ER 225 MG PO TB24: 1.0000 | ORAL_TABLET | Freq: Every day | ORAL | 3 refills | Status: DC

## 2021-07-26 NOTE — Telephone Encounter (Signed)
Submitted PA on CMM. Key: [id number]. Waiting on determination from [facility] [place] Medicare Part D.

## 2021-08-15 ENCOUNTER — Other Ambulatory Visit: Payer: Self-pay | Admitting: Neurology

## 2021-08-15 ENCOUNTER — Telehealth: Payer: Self-pay | Admitting: *Deleted

## 2021-08-15 ENCOUNTER — Other Ambulatory Visit: Payer: Self-pay | Admitting: *Deleted

## 2021-08-15 ENCOUNTER — Other Ambulatory Visit (INDEPENDENT_AMBULATORY_CARE_PROVIDER_SITE_OTHER): Payer: Self-pay

## 2021-08-15 ENCOUNTER — Encounter: Payer: Self-pay | Admitting: Neurology

## 2021-08-15 DIAGNOSIS — Z79899 Other long term (current) drug therapy: Secondary | ICD-10-CM

## 2021-08-15 DIAGNOSIS — G35 Multiple sclerosis: Secondary | ICD-10-CM

## 2021-08-15 DIAGNOSIS — Z0289 Encounter for other administrative examinations: Secondary | ICD-10-CM

## 2021-08-15 NOTE — Telephone Encounter (Signed)
Placed JCV lab in [facility] lock box for routine lab pick up. Results pending. 

## 2021-08-16 LAB — CBC WITH DIFFERENTIAL/PLATELET
Basophils Absolute: 0.1 10*3/uL (ref 0.0–0.2)
Basos: 1 %
EOS (ABSOLUTE): 0.2 10*3/uL (ref 0.0–0.4)
Eos: 2 %
Hematocrit: 33.7 % — ABNORMAL LOW (ref 34.0–46.6)
Hemoglobin: 11.8 g/dL (ref 11.1–15.9)
Immature Grans (Abs): 0.1 10*3/uL (ref 0.0–0.1)
Immature Granulocytes: 1 %
Lymphocytes Absolute: 2.8 10*3/uL (ref 0.7–3.1)
Lymphs: 28 %
MCH: 31.4 pg (ref 26.6–33.0)
MCHC: 35 g/dL (ref 31.5–35.7)
MCV: 90 fL (ref 79–97)
Monocytes Absolute: 0.8 10*3/uL (ref 0.1–0.9)
Monocytes: 8 %
NRBC: 1 % — ABNORMAL HIGH (ref 0–0)
Neutrophils Absolute: 5.9 10*3/uL (ref 1.4–7.0)
Neutrophils: 60 %
Platelets: 170 10*3/uL (ref 150–450)
RBC: 3.76 x10E6/uL — ABNORMAL LOW (ref 3.77–5.28)
RDW: 14.1 % (ref 11.7–15.4)
WBC: 9.8 10*3/uL (ref 3.4–10.8)

## 2021-08-16 LAB — HEPATIC FUNCTION PANEL
ALT: 20 IU/L (ref 0–32)
AST: 18 IU/L (ref 0–40)
Albumin: 4.3 g/dL (ref 3.8–4.8)
Alkaline Phosphatase: 108 IU/L (ref 44–121)
Bilirubin Total: 0.8 mg/dL (ref 0.0–1.2)
Bilirubin, Direct: 0.21 mg/dL (ref 0.00–0.40)
Total Protein: 6.2 g/dL (ref 6.0–8.5)

## 2021-08-17 ENCOUNTER — Telehealth: Payer: Self-pay | Admitting: Neurology

## 2021-08-17 ENCOUNTER — Encounter: Payer: Self-pay | Admitting: Neurology

## 2021-08-17 NOTE — Telephone Encounter (Signed)
Kellie [name] had a COVID-19 infection last month complicated by developing pneumonia.  She is seeing pulmonary in [facility].  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  Liver function tests were also quite elevated (ALT equals 209 and AST equals 78.  Those labs were performed 08/01/2021.  I repeated the liver function labs when she was in the office 08/15/2021.  The liver function tests are now normal.  She has follow-up scheduled with pulmonology.

## 2021-08-22 NOTE — Telephone Encounter (Signed)
JCV ab drawn on 08/15/21 negative, index 0.11.

## 2021-08-26 ENCOUNTER — Encounter: Payer: Self-pay | Admitting: Neurology

## 2021-08-28 ENCOUNTER — Ambulatory Visit: Payer: Medicare Other | Admitting: Family Medicine

## 2021-08-29 ENCOUNTER — Other Ambulatory Visit: Payer: Self-pay | Admitting: *Deleted

## 2021-08-29 MED ORDER — DIAZEPAM 5 MG PO TABS: ORAL_TABLET | ORAL | 0 refills | Status: DC

## 2021-09-30 ENCOUNTER — Other Ambulatory Visit: Payer: Self-pay | Admitting: Neurology

## 2021-10-08 NOTE — Progress Notes (Unsigned)
No chief complaint on file.   HISTORY OF PRESENT ILLNESS:  10/08/21 ALL:  Kellie [name] is a 61 y.o. female here today for follow up for RRMS.   Gabapentin 600-1200mg  daily Valium 5mg  BID  Lamotrigine 100mg  BID methylphenidate 20mg  BID  Losartan  Myrbetriq,   HISTORY (copied from Dr previous note)  Kellie [name] is a 61 y.o. woman with relapsing remitting MS diagnoses in 1997.   Update 02/20/2021: She feels her MS is stable and she continues on Tysabri.    Her last JCV Ab was 08/09/2020 (0.13 negative).       She continues to have falls, mostly due to her left leg and ankle weakness.   She has a left ankle brace.  She has weakness in both legs.   She has some dysesthesias.   Gabapentin and lamotrigine help the dysesthesias.     She has urinary hesitancy and self caths up to 10 times a day.   For spasms she takes Myrbetriq, oxybutynin.  At night she takes desmopressin which helps her reduce the frequency of catheterization so she can sleep better.   Due to frequent UTI, she is on nitrofurantoin.     She has left knee pain and has had fluid drained, steroid injections and Synvisc(or similar).   She is trying to avoid surgery.    She wears a brace on that side.  Mood is better since getting a puppy (she had lost her dog of 13 years).  She is on Effexor and lamotrigine.   Fatigue and attention deficit are better with Ritalin.    She takes valium 10 mg nightly and is sleeping well now.   She has osteoporosis (last checked in 05/2019).   She stopped alendronate a couple years ago.   She fractured her hip in 2011.     She has her Covid 19 vaccinations.  She had Covid-19 Omicron in January 2022.  She feels she never go to her baseline.   She notes having more difficulty with Jazzercise so taking fewer classes now.      MS HISTORY:   She presented with left sided numbness in September 1997.   She underwent an MRI and a lumbar puncture and was told MS was unlikely.   She had right  sided numbness in January 1998 and had brain and spine MRI.    There were changes suggestive of MS and she was diagnosed in January 1998.   She was started on Betaseron.   She was on Betaseron x 10 years and then switched to Avonex for a year due to tolerability issues.   She had clinical exacerbations and MRI changes and then went on Tysabri in 2007 or 2008 and has had 92 infusions, her last infusion being 03/02/15.    She is transferring care from 2008 to [facility].  She tolerates Tysabri well.   She is JCV Ab negative.     An MRI of the spinal cord dated 02/02/2013 shows demyelinating plaque to the left adjacent to C3, anteriorly adjacent to C4, to the right adjacent to C5-C6, posteriorly to the right adjacent to C6-C7. When that study was compared to an MRI dated 12/29/2006, there was no definite interval change. MRI of the brain the same day was abnormal showing signal changes compatible with the diagnosis of multiple sclerosis. Compared to the prior study dated 02/23/2012, there was no definite interval change.      MRI brain and cervical spine (09/2014) showing 4  cervical spine foci and typical demyelination plaques in brain (brain unchanged compared to 2012)..    She had MRI 05/2016 after a fall.  Repeat MRI of the brain and cervical spine 03/20/2017 did not show any new lesions compared to the 2016 MRIs.  STUDIES MRI brain 02/25/20 showed T2 hyperintense foci within the hemispheres and cerebellum in a pattern and configuration consistent with chronic demyelinating plaque associated with multiple sclerosis.  None of the foci appear to be acute.  Compared to the MRI dated 03/20/2017, there are no new lesions.  MRI cervical spine 02/25/2020 showed multiple T2 hyperintense foci within the spinal cord consistent with chronic demyelination associated with multiple sclerosis.  None of the foci appear to be acute and there were no new lesions compared to the 03/20/2017 MRI.    Multilevel degenerative  changes    There is no spinal stenosis.  Foraminal narrowing is noted at several levels but there does not appear to be nerve root compression.  MRI cervical spine 03/21/2017 showed five T2 hyperintense foci within the spinal cord consistent with chronic demyelinating plaque associated with multiple sclerosis. None of these appear to be acute and they did not enhance. When compared to the MRI dated 08/04/2014, there is no interval change.    Multilevel degenerative changes as detailed above. The degenerative changes at C6-C7 have progressed when compared to the previous MRI.  There does not appear to be any definite nerve root compression and there is no spinal stenosis or spinal cord compression.  MRI brain 03/20/2017 multiple T2/FLAIR hyperintense foci in the white matter of the hemispheres and cerebellum in a pattern and configuration consistent with chronic demyelinating plaque associated with multiple sclerosis. None of the foci appears to be acute. When compared to the MRI dated 08/17/2014, there is no interim change.     There is a normal enhancement pattern and there are no acute findings.   Bone density 06/02/2019 showed The BMD measured at [facility] Spine L1-L3 is 0.808 g/cm2 with a T-score of -3.0.   Patient is considered OSTEOPOROTIC according to World Health Organization [facility]) criteria.   REVIEW OF SYSTEMS: Out of a complete 14 system review of symptoms, the patient complains only of the following symptoms, and all other reviewed systems are negative.   ALLERGIES: Allergies  Allergen Reactions   Erythromycin Nausea And Vomiting   Clindamycin/Lincomycin    Dilaudid [Hydromorphone Hcl] Other (See Comments)    Hallucinations   Sulfa Antibiotics      HOME MEDICATIONS: Outpatient Medications Prior to Visit  Medication Sig Dispense Refill   ALPRAZolam (XANAX) 0.25 MG tablet TAKE 1 TABLET BY MOUTH AT BEDTIME AS NEEDED FOR ANXIETY 90 tablet 1   Ascorbic Acid (VITAMIN C PO) Take 500 mg by mouth  daily.     Calcium Carbonate-Vitamin D 600-125 MG-UNIT TABS Take by mouth.     cetirizine (ZYRTEC) 1 MG/ML syrup Take 5 mg by mouth.     Cholecalciferol (VITAMIN D3 PO) Take 1,000 Units by mouth daily.     ciprofloxacin (CIPRO) 250 MG tablet Take 1 tablet (250 mg total) by mouth daily as needed. 14 tablet 0   desmopressin (DDAVP) 0.1 MG tablet TAKE 1 TABLET BY MOUTH EVERY DAY 90 tablet 3   diazepam (VALIUM) 5 MG tablet TAKE 1 TABLET BY MOUTH TWICE DAILY AS NEEDED. 180 tablet 0   famciclovir (FAMVIR) 250 MG tablet TAKE 1 TABLET BY MOUTH TWICE A DAY     Fish Oil-Cholecalciferol (FISH OIL + D3)  1000-1000 MG-UNIT CAPS Take 3,600 mg by mouth daily.      gabapentin (NEURONTIN) 600 MG tablet TAKE 1 TO 2 TABLETS BY MOUTH DAILY 180 tablet 3   lamoTRIgine (LAMICTAL) 100 MG tablet Take 1 tablet (100 mg total) by mouth 2 (two) times daily. 180 tablet 3   losartan (COZAAR) 50 MG tablet TAKE 1 TABLET BY MOUTH EVERY DAY 90 tablet 0   methylphenidate (RITALIN) 10 MG tablet Takes 2 tablets in the morning and 2 tablets midday 360 tablet 0   MYRBETRIQ 50 MG TB24 tablet TAKE 1 TABLET ONCE DAILY. (Patient taking differently: Take 50 mg by mouth 2 (two) times daily.) 30 tablet 6   natalizumab (TYSABRI) 300 MG/15ML injection Inject into the vein.     natalizumab 300 mg in sodium chloride 0.9 % 100 mL Inject into the vein.     nitrofurantoin (MACRODANTIN) 100 MG capsule Take 1 capsule (100 mg total) by mouth at bedtime. 90 capsule 0   omeprazole (PRILOSEC) 20 MG capsule 2 (two) times daily.     oxybutynin (DITROPAN-XL) 10 MG 24 hr tablet TAKE 1 TABLET BY MOUTH DAILY. AS DIRECTED 90 tablet 0   simvastatin (ZOCOR) 40 MG tablet TAKE 1 TABLET AT BEDTIME FOR CHOLESTEROL     Teriparatide, Recombinant, (FORTEO) 600 MCG/2.4ML SOPN Inject into the skin.     Venlafaxine HCl 225 MG TB24 Take 1 tablet (225 mg total) by mouth daily. 90 tablet 3   vitamin B-12 (CYANOCOBALAMIN) 1000 MCG tablet Take 2,500 mcg by mouth daily.       Wheat Dextrin (BENEFIBER) POWD Take 1 Dose by mouth every morning.     No facility-administered medications prior to visit.     PAST MEDICAL HISTORY: Past Medical History:  Diagnosis Date   Chronic kidney disease    Complication of anesthesia    pt states she got thrush after anesthesia   Multiple sclerosis (HCC)    Neuropathy    Vision abnormalities      PAST SURGICAL HISTORY: Past Surgical History:  Procedure Laterality Date   CARPAL TUNNEL RELEASE Right    2010   CARPAL TUNNEL RELEASE Left    CESAREAN SECTION     COLONOSCOPY  07/26/14   ENDOMETRIAL ABLATION     2007   HIP FRACTURE SURGERY     hysterctomy     TUBAL LIGATION       FAMILY HISTORY: Family History  Problem Relation Age of Onset   Lung cancer Mother    Dementia Father    Diverticulitis Father    Pancreatitis Father    Breast cancer Cousin      SOCIAL HISTORY: Social History   Socioeconomic History   Marital status: Significant Other    Spouse name: Not on file   Number of children: Not on file   Years of education: Not on file   Highest education level: Not on file  Occupational History   Not on file  Tobacco Use   Smoking status: Former    Types: Cigarettes    Quit date: 03/29/1981    Years since quitting: 40.5   Smokeless tobacco: Never  Substance and Sexual Activity   Alcohol use: Yes    Alcohol/week: 0.0 standard drinks of alcohol    Comment: Occasional   Drug use: No   Sexual activity: Not on file  Other Topics Concern   Not on file  Social History Narrative   Not on file   Social Determinants of Health  Financial Resource Strain: Not on file  Food Insecurity: Not on file  Transportation Needs: Not on file  Physical Activity: Not on file  Stress: Not on file  Social Connections: Not on file  Intimate Partner Violence: Not on file     PHYSICAL EXAM  There were no vitals filed for this visit. There is no height or weight on file to calculate BMI.  Generalized:  Well developed, in no acute distress  Cardiology: normal rate and rhythm, no murmur auscultated  Respiratory: clear to auscultation bilaterally    Neurological examination  Mentation: Alert oriented to time, place, history taking. Follows all commands speech and language fluent Cranial nerve II-XII: Pupils were equal round reactive to light. Extraocular movements were full, visual field were full on confrontational test. Facial sensation and strength were normal. Uvula tongue midline. Head turning and shoulder shrug  were normal and symmetric. Motor: The motor testing reveals 5 over 5 strength of all 4 extremities. Good symmetric motor tone is noted throughout.  Sensory: Sensory testing is intact to soft touch on all 4 extremities. No evidence of extinction is noted.  Coordination: Cerebellar testing reveals good finger-nose-finger and heel-to-shin bilaterally.  Gait and station: Gait is normal. Tandem gait is normal. Romberg is negative. No drift is seen.  Reflexes: Deep tendon reflexes are symmetric and normal bilaterally.    DIAGNOSTIC DATA (LABS, IMAGING, TESTING) - I reviewed patient records, labs, notes, testing and imaging myself where available.  Lab Results  Component Value Date   WBC 9.8 08/15/2021   HGB 11.8 08/15/2021   HCT 33.7 (L) 08/15/2021   MCV 90 08/15/2021   PLT 170 08/15/2021      Component Value Date/Time   PROT 6.2 08/15/2021 1206   ALBUMIN 4.3 08/15/2021 1206   AST 18 08/15/2021 1206   ALT 20 08/15/2021 1206   ALKPHOS 108 08/15/2021 1206   BILITOT 0.8 08/15/2021 1206   Lab Results  Component Value Date   CHOL 136 09/29/2007   HDL 41.5 09/29/2007   LDLCALC 69 09/29/2007   TRIG 126 09/29/2007   CHOLHDL 3.3 CALC 09/29/2007   No results found for: "HGBA1C" No results found for: "VITAMINB12" No results found for: "TSH"      No data to display               No data to display           ASSESSMENT AND PLAN  61 y.o. year old female  has  a past medical history of Chronic kidney disease, Complication of anesthesia, Multiple sclerosis (HCC), Neuropathy, and Vision abnormalities. here with    No diagnosis found.  Joneen Boers ***.  Healthy lifestyle habits encouraged. *** will follow up with PCP as directed. *** will return to see me in ***, sooner if needed. *** verbalizes understanding and agreement with this plan.   No orders of the defined types were placed in this encounter.    No orders of the defined types were placed in this encounter.    Shawnie Dapper, MSN, FNP-C 10/08/2021, 12:55 PM  [facility] [address] [place], [place] [address] [phone]

## 2021-10-08 NOTE — Patient Instructions (Signed)
Below is our plan:  We will continue current   Please make sure you are staying well hydrated. I recommend 50-60 ounces daily. Well balanced diet and regular exercise encouraged. Consistent sleep schedule with 6-8 hours recommended.   Please continue follow up with care team as directed.   Follow up with Dr Epimenio Foot in 6 months   You may receive a survey regarding today's visit. I encourage you to leave honest feed back as I do use this information to improve patient care. Thank you for seeing me today!

## 2021-10-09 ENCOUNTER — Encounter: Payer: Self-pay | Admitting: Family Medicine

## 2021-10-09 ENCOUNTER — Ambulatory Visit: Payer: Medicare Other | Admitting: Family Medicine

## 2021-10-09 VITALS — BP 116/67 | HR 75 | Ht 62.0 in | Wt 140.0 lb

## 2021-10-09 DIAGNOSIS — G35 Multiple sclerosis: Secondary | ICD-10-CM

## 2021-10-09 DIAGNOSIS — R269 Unspecified abnormalities of gait and mobility: Secondary | ICD-10-CM

## 2021-10-09 DIAGNOSIS — F32A Depression, unspecified: Secondary | ICD-10-CM

## 2021-10-09 DIAGNOSIS — R5383 Other fatigue: Secondary | ICD-10-CM

## 2021-10-09 DIAGNOSIS — F988 Other specified behavioral and emotional disorders with onset usually occurring in childhood and adolescence: Secondary | ICD-10-CM

## 2021-10-09 DIAGNOSIS — G35D Multiple sclerosis, unspecified: Secondary | ICD-10-CM

## 2021-10-09 DIAGNOSIS — Z79899 Other long term (current) drug therapy: Secondary | ICD-10-CM | POA: Diagnosis not present

## 2021-10-09 DIAGNOSIS — Z87718 Personal history of other specified (corrected) congenital malformations of genitourinary system: Secondary | ICD-10-CM

## 2021-10-10 ENCOUNTER — Other Ambulatory Visit: Payer: Self-pay | Admitting: Family Medicine

## 2021-10-10 DIAGNOSIS — Z1231 Encounter for screening mammogram for malignant neoplasm of breast: Secondary | ICD-10-CM

## 2021-10-16 ENCOUNTER — Other Ambulatory Visit: Payer: Self-pay | Admitting: Neurology

## 2021-10-18 ENCOUNTER — Ambulatory Visit: Payer: Medicare Other

## 2021-10-29 ENCOUNTER — Other Ambulatory Visit: Payer: Self-pay | Admitting: Neurology

## 2021-10-29 MED ORDER — METHYLPHENIDATE HCL 10 MG PO TABS
ORAL_TABLET | ORAL | 0 refills | Status: DC
Start: 2021-10-29 — End: 2021-11-06

## 2021-11-01 ENCOUNTER — Ambulatory Visit: Payer: Medicare Other

## 2021-11-01 ENCOUNTER — Encounter: Payer: Self-pay | Admitting: Family Medicine

## 2021-11-06 ENCOUNTER — Other Ambulatory Visit: Payer: Self-pay | Admitting: Neurology

## 2021-11-08 ENCOUNTER — Telehealth: Payer: Self-pay | Admitting: Family Medicine

## 2021-11-08 NOTE — Telephone Encounter (Signed)
Pt here today for Tysabri infusion. States pharmacy telling her PA still needed for methylphenidate. [phone].  I called and spoke w/ pharmacist who confirmed PA needed and we were told wrong yesterday. She tried qty 120 and 90 and PA needed for both.  RxBIN: [id number], BIN: [id number], Grp: [id number], ID: [id number]. Aware we will work on [place].   Kim/intrafusion informed pt she can fill rx for qty 90 w/ discount card for 29.58 for me per pharmacy.   Per Dr. Epimenio Foot, since pt reports she only takes 2 in the am and 1 in the pm prn, change qty to 90/30days. I submitted urgent PA for this on CMM. Key: [id number]. Waiting on determination from [facility] [place] Medicare Part D.  Received instant approval: "Effective from 11/08/2021 through 11/09/2022." I called pt and informed her. She verbalized understanding and appreciation.

## 2021-11-08 NOTE — Telephone Encounter (Signed)
[employer] ([place]) calling with approval for  methylphenidate (RITALIN) 10 MG tablet 11/08/21-11/09/22

## 2021-11-20 ENCOUNTER — Ambulatory Visit
Admission: RE | Admit: 2021-11-20 | Discharge: 2021-11-20 | Disposition: A | Discharge status: Home / Self Care | Payer: Medicare Other | Source: Ambulatory Visit | Attending: Family Medicine | Admitting: Family Medicine

## 2021-11-20 DIAGNOSIS — Z1231 Encounter for screening mammogram for malignant neoplasm of breast: Secondary | ICD-10-CM

## 2021-11-28 ENCOUNTER — Other Ambulatory Visit: Payer: Self-pay | Admitting: Neurology

## 2021-11-29 ENCOUNTER — Encounter: Payer: Self-pay | Admitting: Neurology

## 2021-12-06 ENCOUNTER — Other Ambulatory Visit: Payer: Self-pay

## 2021-12-06 MED ORDER — ALPRAZOLAM 0.25 MG PO TABS: 0.2500 mg | ORAL_TABLET | Freq: Every evening | ORAL | 0 refills | Status: AC | PRN

## 2021-12-07 MED ORDER — METHYLPHENIDATE HCL 10 MG PO TABS: ORAL_TABLET | ORAL | 0 refills | Status: DC

## 2022-01-01 ENCOUNTER — Other Ambulatory Visit: Payer: Self-pay | Admitting: Neurology

## 2022-01-07 ENCOUNTER — Other Ambulatory Visit: Payer: Self-pay | Admitting: Neurology

## 2022-01-25 ENCOUNTER — Other Ambulatory Visit: Payer: Self-pay | Admitting: Neurology

## 2022-01-28 MED ORDER — METHYLPHENIDATE HCL 10 MG PO TABS: ORAL_TABLET | ORAL | 0 refills | Status: DC

## 2022-02-12 ENCOUNTER — Other Ambulatory Visit: Payer: Self-pay | Admitting: Neurology

## 2022-02-25 ENCOUNTER — Other Ambulatory Visit: Payer: Self-pay | Admitting: Neurology

## 2022-03-06 ENCOUNTER — Telehealth: Payer: Self-pay | Admitting: *Deleted

## 2022-03-06 ENCOUNTER — Other Ambulatory Visit: Payer: Self-pay | Admitting: *Deleted

## 2022-03-06 ENCOUNTER — Other Ambulatory Visit (INDEPENDENT_AMBULATORY_CARE_PROVIDER_SITE_OTHER): Payer: Self-pay

## 2022-03-06 DIAGNOSIS — G35 Multiple sclerosis: Secondary | ICD-10-CM

## 2022-03-06 DIAGNOSIS — Z0289 Encounter for other administrative examinations: Secondary | ICD-10-CM

## 2022-03-06 DIAGNOSIS — Z79899 Other long term (current) drug therapy: Secondary | ICD-10-CM

## 2022-03-06 NOTE — Telephone Encounter (Signed)
Placed JCV lab in [facility] lock box for routine lab pick up. Results pending. 

## 2022-03-07 ENCOUNTER — Encounter: Payer: Self-pay | Admitting: Neurology

## 2022-03-07 LAB — CBC WITH DIFFERENTIAL/PLATELET
Basophils Absolute: 0.1 10*3/uL (ref 0.0–0.2)
Basos: 1 %
EOS (ABSOLUTE): 0.3 10*3/uL (ref 0.0–0.4)
Eos: 5 %
Hematocrit: 27.6 % — ABNORMAL LOW (ref 34.0–46.6)
Hemoglobin: 9.7 g/dL — ABNORMAL LOW (ref 11.1–15.9)
Immature Grans (Abs): 0.1 10*3/uL (ref 0.0–0.1)
Immature Granulocytes: 1 %
Lymphocytes Absolute: 2.4 10*3/uL (ref 0.7–3.1)
Lymphs: 41 %
MCH: 32.7 pg (ref 26.6–33.0)
MCHC: 35.1 g/dL (ref 31.5–35.7)
MCV: 93 fL (ref 79–97)
Monocytes Absolute: 0.4 10*3/uL (ref 0.1–0.9)
Monocytes: 7 %
NRBC: 1 % — ABNORMAL HIGH (ref 0–0)
Neutrophils Absolute: 2.6 10*3/uL (ref 1.4–7.0)
Neutrophils: 45 %
Platelets: 187 10*3/uL (ref 150–450)
RBC: 2.97 x10E6/uL — ABNORMAL LOW (ref 3.77–5.28)
RDW: 13.1 % (ref 11.7–15.4)
WBC: 5.9 10*3/uL (ref 3.4–10.8)

## 2022-03-07 NOTE — Telephone Encounter (Signed)
Pt called wanting to discuss her lab results with the RN. Please advise.

## 2022-03-07 NOTE — Telephone Encounter (Signed)
Dr Epimenio Foot is not in the office and has not yet reviewed the lab results officially. I sent a my chart message to the pt informing this.

## 2022-03-14 NOTE — Telephone Encounter (Signed)
JCV ab drawn on 03/06/22 negative, index: 0.12.

## 2022-03-27 ENCOUNTER — Other Ambulatory Visit: Payer: Self-pay | Admitting: Neurology

## 2022-03-28 MED ORDER — METHYLPHENIDATE HCL 10 MG PO TABS: ORAL_TABLET | ORAL | 0 refills | Status: DC

## 2022-04-04 ENCOUNTER — Other Ambulatory Visit: Payer: Self-pay | Admitting: Neurology

## 2022-04-12 ENCOUNTER — Encounter: Payer: Self-pay | Admitting: Neurology

## 2022-04-23 ENCOUNTER — Encounter: Payer: Self-pay | Admitting: Neurology

## 2022-04-23 ENCOUNTER — Ambulatory Visit: Payer: Medicare Other | Admitting: Neurology

## 2022-04-23 VITALS — BP 103/70 | HR 76 | Ht 62.0 in | Wt 142.5 lb

## 2022-04-23 DIAGNOSIS — R269 Unspecified abnormalities of gait and mobility: Secondary | ICD-10-CM

## 2022-04-23 DIAGNOSIS — Z87718 Personal history of other specified (corrected) congenital malformations of genitourinary system: Secondary | ICD-10-CM

## 2022-04-23 DIAGNOSIS — F988 Other specified behavioral and emotional disorders with onset usually occurring in childhood and adolescence: Secondary | ICD-10-CM

## 2022-04-23 DIAGNOSIS — G35 Multiple sclerosis: Secondary | ICD-10-CM

## 2022-04-23 DIAGNOSIS — M4317 Spondylolisthesis, lumbosacral region: Secondary | ICD-10-CM

## 2022-04-23 DIAGNOSIS — R5383 Other fatigue: Secondary | ICD-10-CM | POA: Diagnosis not present

## 2022-04-23 DIAGNOSIS — Z79899 Other long term (current) drug therapy: Secondary | ICD-10-CM | POA: Diagnosis not present

## 2022-04-23 DIAGNOSIS — G8929 Other chronic pain: Secondary | ICD-10-CM

## 2022-04-23 DIAGNOSIS — F32A Depression, unspecified: Secondary | ICD-10-CM

## 2022-04-23 DIAGNOSIS — M545 Low back pain, unspecified: Secondary | ICD-10-CM

## 2022-04-23 MED ORDER — METHYLPHENIDATE HCL 10 MG PO TABS: ORAL_TABLET | ORAL | 0 refills | Status: DC

## 2022-04-23 NOTE — Progress Notes (Signed)
[facility]  PATIENT: Kellie [name] DOB: March 29, 1960  REFERRING CLINICIAN: Alma Friendly HISTORY FROM: Patient  REASON FOR VISIT: Multiple Sclerosis   HISTORICAL  CHIEF COMPLAINT:  Chief Complaint  Patient presents with   Follow-up    Room 2. Pt alone. MS follow up, patient is on Tysabri, infusion scheduled for tomorrow.     HISTORY OF PRESENT ILLNESS:  Kellie [name] is a 62 y.o. woman with relapsing remitting MS diagnoses in 1997.   Update 04/23/2022: She feels her MS is mostly stable and she continues on Tysabri.    She has no exacerbations but notes some worsening symptoms.       Her last JCV Ab was 03/06/2022 (0.12 negative).   She fell and has a bruise on her right shoulder and also hit her head.   She has a left ankle brace.  She has weakness in both legs.   She has some dysesthesias.   Gabapentin and lamotrigine help the dysesthesias.     She has urinary hesitancy and self caths up to 10 times a day.   For spasms she takes Myrbetriq, oxybutynin.  At night she takes desmopressin which helps her reduce the frequency of catheterization so she can sleep better.   Due to frequent UTI, she is on nitrofurantoin.     LBP is worse.   She saw a Pain Management doctor at [facility] and had a lumbar xray sowing Mild anterior spondylolisthesis of L4 on L5.  Minimal anterior spondylolisthesis of L3 on L4.    Slight retrolisthesis of L2 on L3.  Multilevel degenerative changes in the lumbar spine, most pronounced at L4-5 and L5-S1.   She will be starting PT.  She has left knee pain.  An injection had not helped,     She is trying to avoid surgery.    She wears a brace on that side.  Mood is better since getting a puppy (she had lost her dog of 13 years).  She is on Effexor and lamotrigine.   Fatigue and attention deficit are better with Ritalin.    She takes valium 10 mg nightly and is sleeping well now.   She has osteoporosis (last checked in 05/2019).   She stopped alendronate a  couple years ago and is now on Forteo.   She fractured her hip in 2011.         MS HISTORY:   She presented with left sided numbness in September 1997.   She underwent an MRI and a lumbar puncture and was told MS was unlikely.   She had right sided numbness in January 1998 and had brain and spine MRI.    There were changes suggestive of MS and she was diagnosed in January 1998.   She was started on Betaseron.   She was on Betaseron x 10 years and then switched to Avonex for a year due to tolerability issues.   She had clinical exacerbations and MRI changes and then went on Tysabri in 2007 or 2008 and has had 92 infusions, her last infusion being 03/02/15.    She is transferring care from [facility] to [facility].  She tolerates Tysabri well.   She is JCV Ab negative.     An MRI of the spinal cord dated 02/02/2013 shows demyelinating plaque to the left adjacent to C3, anteriorly adjacent to C4, to the right adjacent to C5-C6, posteriorly to the right adjacent to C6-C7. When that study was compared to an MRI dated 12/29/2006, there  was no definite interval change. MRI of the brain the same day was abnormal showing signal changes compatible with the diagnosis of multiple sclerosis. Compared to the prior study dated 02/23/2012, there was no definite interval change.      MRI brain and cervical spine (09/2014) showing 4 cervical spine foci and typical demyelination plaques in brain (brain unchanged compared to 2012)..    She had MRI 05/2016 after a fall.  Repeat MRI of the brain and cervical spine 03/20/2017 did not show any new lesions compared to the 2016 MRIs.  STUDIES MRI brain 02/25/20 showed T2 hyperintense foci within the hemispheres and cerebellum in a pattern and configuration consistent with chronic demyelinating plaque associated with multiple sclerosis.  None of the foci appear to be acute.  Compared to the MRI dated 03/20/2017, there are no new lesions.  MRI cervical spine 02/25/2020 showed  multiple T2 hyperintense foci within the spinal cord consistent with chronic demyelination associated with multiple sclerosis.  None of the foci appear to be acute and there were no new lesions compared to the 03/20/2017 MRI.    Multilevel degenerative changes    There is no spinal stenosis.  Foraminal narrowing is noted at several levels but there does not appear to be nerve root compression.  MRI cervical spine 03/21/2017 showed five T2 hyperintense foci within the spinal cord consistent with chronic demyelinating plaque associated with multiple sclerosis. None of these appear to be acute and they did not enhance. When compared to the MRI dated 08/04/2014, there is no interval change.    Multilevel degenerative changes as detailed above. The degenerative changes at C6-C7 have progressed when compared to the previous MRI.  There does not appear to be any definite nerve root compression and there is no spinal stenosis or spinal cord compression.  MRI brain 03/20/2017 multiple T2/FLAIR hyperintense foci in the white matter of the hemispheres and cerebellum in a pattern and configuration consistent with chronic demyelinating plaque associated with multiple sclerosis. None of the foci appears to be acute. When compared to the MRI dated 08/17/2014, there is no interim change.     There is a normal enhancement pattern and there are no acute findings.   Bone density 06/02/2019 showed The BMD measured at AP Spine L1-L3 is 0.808 g/cm2 with a T-score of -3.0.   Patient is considered OSTEOPOROTIC according to [facility]) criteria.  REVIEW OF SYSTEMS:  Constitutional: No fevers, chills, sweats, or change in appetite.   Notes fatigue and some insomnia Eyes: No visual changes, double vision, eye pain Ear, nose and throat: No hearing loss, ear pain, nasal congestion, sore throat Cardiovascular: No chest pain, palpitations Respiratory:  No shortness of breath at rest or with exertion.   No  wheezes GastrointestinaI: No nausea, vomiting, diarrhea, abdominal pain.   She has dysphagia Genitourinary: as above. Musculoskeletal:  as above iiIntegumentary: No rash, pruritus, skin lesions Neurological: as above Psychiatric: Mild depression at this time.  Mild anxiety Endocrine: No palpitations, diaphoresis, change in appetite, change in weigh or increased thirst Hematologic/Lymphatic:  No anemia, purpura, petechiae. Allergic/Immunologic: No itchy/runny eyes, nasal congestion, recent allergic reactions, rashes  ALLERGIES: Allergies  Allergen Reactions   Erythromycin Nausea And Vomiting   Clindamycin/Lincomycin    Dilaudid [Hydromorphone Hcl] Other (See Comments)    Hallucinations   Sulfa Antibiotics     HOME MEDICATIONS:  Current Outpatient Medications:    ALPRAZolam (XANAX) 0.25 MG tablet, TAKE 1 TABLET BY MOUTH AT BEDTIME AS NEEDED FOR  ANXIETY, Disp: 90 tablet, Rfl: 1   Ascorbic Acid (VITAMIN C PO), Take 500 mg by mouth daily., Disp: , Rfl:    Calcium Carbonate-Vitamin D 600-125 MG-UNIT TABS, Take by mouth., Disp: , Rfl:    cetirizine (ZYRTEC) 1 MG/ML syrup, Take 5 mg by mouth., Disp: , Rfl:    Cholecalciferol (VITAMIN D3 PO), Take 1,000 Units by mouth daily., Disp: , Rfl:    ciprofloxacin (CIPRO) 250 MG tablet, Take 1 tablet (250 mg total) by mouth daily as needed., Disp: 14 tablet, Rfl: 0   desmopressin (DDAVP) 0.1 MG tablet, TAKE 1 TABLET BY MOUTH EVERY DAY, Disp: 90 tablet, Rfl: 3   diazepam (VALIUM) 5 MG tablet, TAKE ONE TABLET BY MOUTH TWO TIMES DAILY AS NEEDED (Patient taking differently: Take 2 tablets at bedtime), Disp: 180 tablet, Rfl: 1   famciclovir (FAMVIR) 250 MG tablet, TAKE 1 TABLET BY MOUTH TWICE A DAY, Disp: , Rfl:    Fish Oil-Cholecalciferol (FISH OIL + D3) 1000-1000 MG-UNIT CAPS, Take 3,600 mg by mouth daily. , Disp: , Rfl:    gabapentin (NEURONTIN) 600 MG tablet, TAKE 1 TO 2 TABLETS BY MOUTH DAILY, Disp: 180 tablet, Rfl: 1   lamoTRIgine (LAMICTAL) 100 MG  tablet, Take 1 tablet (100 mg total) by mouth 2 (two) times daily., Disp: 180 tablet, Rfl: 3   losartan (COZAAR) 50 MG tablet, Take 1 tablet (50 mg total) by mouth daily., Disp: 30 tablet, Rfl: 5   methylphenidate (RITALIN) 10 MG tablet, Take 1 tablet (10 mg total) by mouth 4 (four) times daily. Takes 2 tablets in the morning and 2 tablets midday, Disp: 360 tablet, Rfl: 0   MYRBETRIQ 50 MG TB24 tablet, TAKE 1 TABLET ONCE DAILY. (Patient taking differently: Take 50 mg by mouth 2 (two) times daily.), Disp: 30 tablet, Rfl: 6   natalizumab (TYSABRI) 300 MG/15ML injection, Inject into the vein., Disp: , Rfl:    natalizumab 300 mg in sodium chloride 0.9 % 100 mL, Inject into the vein., Disp: , Rfl:    nitrofurantoin (MACRODANTIN) 100 MG capsule, Take 1 capsule (100 mg total) by mouth at bedtime., Disp: 90 capsule, Rfl: 0   omeprazole (PRILOSEC) 20 MG capsule, 2 (two) times daily., Disp: , Rfl:    oxybutynin (DITROPAN-XL) 10 MG 24 hr tablet, TAKE 1 TABLET BY MOUTH DAILY. AS DIRECTED, Disp: 90 tablet, Rfl: 0   simvastatin (ZOCOR) 40 MG tablet, TAKE 1 TABLET AT BEDTIME FOR CHOLESTEROL, Disp: , Rfl:    Teriparatide, Recombinant, (FORTEO) 600 MCG/2.4ML SOPN, Inject into the skin., Disp: , Rfl:    venlafaxine XR (EFFEXOR-XR) 150 MG 24 hr capsule, TAKE 2 CAPSULES (300 MG TOTAL) BY MOUTH DAILY., Disp: 180 capsule, Rfl: 3   vitamin B-12 (CYANOCOBALAMIN) 1000 MCG tablet, Take 2,500 mcg by mouth daily. , Disp: , Rfl:    Wheat Dextrin (BENEFIBER) POWD, Take 1 Dose by mouth every morning., Disp: , Rfl:    PAST MEDICAL HISTORY: Past Medical History:  Diagnosis Date   Chronic kidney disease    Complication of anesthesia    pt states she got thrush after anesthesia   Multiple sclerosis ([place])    Neuropathy    Vision abnormalities     PAST SURGICAL HISTORY: Past Surgical History:  Procedure Laterality Date   CARPAL TUNNEL RELEASE Right    2010   CARPAL TUNNEL RELEASE Left    CESAREAN SECTION      COLONOSCOPY  07/26/14   ENDOMETRIAL ABLATION     2007  HIP FRACTURE SURGERY     hysterctomy     TUBAL LIGATION      FAMILY HISTORY: Family History  Problem Relation Age of Onset   Lung cancer Mother    Dementia Father    Diverticulitis Father    Pancreatitis Father    Breast cancer Cousin     SOCIAL HISTORY:  Social History   Socioeconomic History   Marital status: Significant Other    Spouse name: Not on file   Number of children: Not on file   Years of education: Not on file   Highest education level: Not on file  Occupational History   Not on file  Tobacco Use   Smoking status: Former    Types: Cigarettes    Quit date: 03/29/1981    Years since quitting: 39.9   Smokeless tobacco: Never  Substance and Sexual Activity   Alcohol use: Yes    Alcohol/week: 0.0 standard drinks    Comment: Occasional   Drug use: No   Sexual activity: Not on file  Other Topics Concern   Not on file  Social History Narrative   Not on file   Social Determinants of Health   Financial Resource Strain: Not on file  Food Insecurity: Not on file  Transportation Needs: Not on file  Physical Activity: Not on file  Stress: Not on file  Social Connections: Not on file  Intimate Partner Violence: Not on file     PHYSICAL EXAM from 02/12/2018  Vitals:   02/20/21 1121  BP: (!) 142/81  Pulse: 72  Weight: 143 lb 8 oz (65.1 kg)  Height: 5' 2"$  (1.575 m)    Body mass index is 26.25 kg/m.   General: The patient is well-developed and well-nourished and in no acute distress   Neurologic Exam  Mental status: The patient is alert and oriented x 3 at the time of the examination. The patient has apparent normal recent and remote memory, with an apparently normal attention span and concentration ability.   Speech is normal.  Cranial nerves: Extraocular movements are full.  Facial strength and sensation is normal.  Hearing seemed normal.   Motor:  Muscle bulk is normal.    Muscle tone is  increased in the legs, right greater than left.  Strength is 5/5 except 4+/5 iliopsoas muscles  Sensory: Se has intact touch and vibration in arms and reduced vibraiton in legs, left much worse than right .   Coordination: Cerebellar testing shows good finger-nose-finger but mildly reduced heel-to-shin.  Gait and station: Station is normal.   She has a mildly wide gait and left spasticity  Tandem is wide.  She does not need support.  Romberg is negative.  Reflexes: Deep tendon reflexes are increased bilaterally in legs.  DTRs are increased at the knees with spread.  There is no ankle clonus.  Plantar responses are normal.     Multiple sclerosis (HCC)  Abnormality of gait  Attention deficit disorder (ADD) without hyperactivity  Depression, unspecified depression type  High risk medication use  Numbness  Other fatigue   1.   Continue Tysabri.   She is JCV antibody negative Check MRI brain and cerv spine to determine if any breakthrough activity (if present consider a different DMT) 2.   She will continue to stay active and exercise as tolerated.    3.    Continue medications including Ritalin for ADD/fatigue.  4.    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  She is starting PT. She will return to see [place] in 6 months or sooner if there are new or worsening neurologic symptoms.  42-minute office visit with the majority of the time spent face-to-face for history and physical, discussion/counseling and decision-making.  Additional time with record review and documentation.   Ranier Coach A. Felecia Shelling, MD, PhD [id number], [id number] AM Certified in Neurology, Clinical Neurophysiology, Sleep Medicine, Pain Medicine and Neuroimaging  [facility] [address] [place], [place] [address] [phone]

## 2022-04-24 ENCOUNTER — Telehealth: Payer: Self-pay | Admitting: Neurology

## 2022-04-24 NOTE — Telephone Encounter (Signed)
[employer] medicare [name]: [id number] exp. 2/14/024-05/23/22 sent to GI [phone]

## 2022-05-01 ENCOUNTER — Other Ambulatory Visit: Payer: Self-pay | Admitting: Neurology

## 2022-05-02 ENCOUNTER — Other Ambulatory Visit: Payer: Self-pay | Admitting: Neurology

## 2022-05-02 NOTE — Telephone Encounter (Signed)
Pt last seen on 04/23/22 with Dr.Sater per note "She takes valium 10 mg nightly and is sleeping well now. " Rx last filled on 01/02/22 #180  You are the work in pm provider Rx pending for you to sign

## 2022-05-06 NOTE — Telephone Encounter (Signed)
Last seen on 04/23/2022

## 2022-05-08 ENCOUNTER — Other Ambulatory Visit: Payer: Self-pay | Admitting: Family Medicine

## 2022-05-08 DIAGNOSIS — M810 Age-related osteoporosis without current pathological fracture: Secondary | ICD-10-CM

## 2022-05-15 ENCOUNTER — Ambulatory Visit
Admission: RE | Admit: 2022-05-15 | Discharge: 2022-05-15 | Disposition: A | Discharge status: Home / Self Care | Payer: Medicare Other | Source: Ambulatory Visit | Attending: Neurology | Admitting: Neurology

## 2022-05-15 DIAGNOSIS — R269 Unspecified abnormalities of gait and mobility: Secondary | ICD-10-CM

## 2022-05-15 DIAGNOSIS — G8929 Other chronic pain: Secondary | ICD-10-CM

## 2022-05-15 DIAGNOSIS — G35 Multiple sclerosis: Secondary | ICD-10-CM

## 2022-05-15 DIAGNOSIS — M545 Low back pain, unspecified: Secondary | ICD-10-CM | POA: Diagnosis not present

## 2022-05-15 DIAGNOSIS — M4317 Spondylolisthesis, lumbosacral region: Secondary | ICD-10-CM | POA: Diagnosis not present

## 2022-05-16 ENCOUNTER — Encounter: Payer: Self-pay | Admitting: Neurology

## 2022-05-22 ENCOUNTER — Ambulatory Visit
Admission: RE | Admit: 2022-05-22 | Discharge: 2022-05-22 | Disposition: A | Discharge status: Home / Self Care | Payer: Medicare Other | Source: Ambulatory Visit | Attending: Neurology | Admitting: Neurology

## 2022-05-22 DIAGNOSIS — R269 Unspecified abnormalities of gait and mobility: Secondary | ICD-10-CM

## 2022-05-22 DIAGNOSIS — G35 Multiple sclerosis: Secondary | ICD-10-CM

## 2022-06-25 ENCOUNTER — Other Ambulatory Visit: Payer: Self-pay | Admitting: Neurology

## 2022-06-25 ENCOUNTER — Encounter: Payer: Self-pay | Admitting: Neurology

## 2022-06-26 MED ORDER — METHYLPHENIDATE HCL 10 MG PO TABS: ORAL_TABLET | ORAL | 0 refills | Status: DC

## 2022-06-26 NOTE — Telephone Encounter (Signed)
Pt last seen 04/23/22 and next f/u 11/14/22. Last refilled methylphenidate 04/30/22 #90.

## 2022-08-12 ENCOUNTER — Other Ambulatory Visit: Payer: Self-pay | Admitting: Neurology

## 2022-08-12 MED ORDER — METHYLPHENIDATE HCL 10 MG PO TABS: ORAL_TABLET | ORAL | 0 refills | Status: DC

## 2022-08-13 ENCOUNTER — Other Ambulatory Visit: Payer: Self-pay | Admitting: Neurology

## 2022-08-13 NOTE — Telephone Encounter (Signed)
Last seen on 04/23/22 per note "Gabapentin and lamotrigine help the dysesthesias. " Follow up scheduled on 11/14/22 Last filled on 05/13/22 #180 tablets (90 day supply)

## 2022-08-21 ENCOUNTER — Telehealth: Payer: Self-pay | Admitting: *Deleted

## 2022-08-21 ENCOUNTER — Other Ambulatory Visit: Payer: Self-pay | Admitting: *Deleted

## 2022-08-21 ENCOUNTER — Other Ambulatory Visit: Payer: Self-pay

## 2022-08-21 DIAGNOSIS — Z79899 Other long term (current) drug therapy: Secondary | ICD-10-CM

## 2022-08-21 DIAGNOSIS — G35 Multiple sclerosis: Secondary | ICD-10-CM

## 2022-08-21 NOTE — Telephone Encounter (Signed)
Placed JCV lab in [facility] lock box for routine lab pick up. Results pending. 

## 2022-08-22 LAB — CBC WITH DIFFERENTIAL/PLATELET
Basophils Absolute: 0.1 10*3/uL (ref 0.0–0.2)
Basos: 1 %
EOS (ABSOLUTE): 0.4 10*3/uL (ref 0.0–0.4)
Eos: 7 %
Hematocrit: 31.9 % — ABNORMAL LOW (ref 34.0–46.6)
Hemoglobin: 10.7 g/dL — ABNORMAL LOW (ref 11.1–15.9)
Immature Grans (Abs): 0.1 10*3/uL (ref 0.0–0.1)
Immature Granulocytes: 2 %
Lymphocytes Absolute: 2.2 10*3/uL (ref 0.7–3.1)
Lymphs: 35 %
MCH: 30.5 pg (ref 26.6–33.0)
MCHC: 33.5 g/dL (ref 31.5–35.7)
MCV: 91 fL (ref 79–97)
Monocytes Absolute: 0.5 10*3/uL (ref 0.1–0.9)
Monocytes: 8 %
NRBC: 1 % — ABNORMAL HIGH (ref 0–0)
Neutrophils Absolute: 3 10*3/uL (ref 1.4–7.0)
Neutrophils: 47 %
Platelets: 250 10*3/uL (ref 150–450)
RBC: 3.51 x10E6/uL — ABNORMAL LOW (ref 3.77–5.28)
RDW: 13.3 % (ref 11.7–15.4)
WBC: 6.3 10*3/uL (ref 3.4–10.8)

## 2022-08-27 NOTE — Telephone Encounter (Signed)
JCV Ab drawn on 08/21/22 negative, index: 0.12.

## 2022-09-04 ENCOUNTER — Other Ambulatory Visit: Payer: Self-pay | Admitting: Neurology

## 2022-09-22 ENCOUNTER — Other Ambulatory Visit: Payer: Self-pay | Admitting: Neurology

## 2022-09-23 MED ORDER — METHYLPHENIDATE HCL 10 MG PO TABS: ORAL_TABLET | ORAL | 0 refills | Status: DC

## 2022-09-23 NOTE — Telephone Encounter (Signed)
Last seen on 04/23/22 Follow up scheduled on 11/14/22

## 2022-09-24 ENCOUNTER — Other Ambulatory Visit: Payer: Self-pay | Admitting: Neurology

## 2022-09-25 ENCOUNTER — Telehealth: Payer: Self-pay | Admitting: *Deleted

## 2022-09-25 ENCOUNTER — Other Ambulatory Visit (HOSPITAL_COMMUNITY): Payer: Self-pay

## 2022-09-25 ENCOUNTER — Telehealth: Payer: Self-pay

## 2022-09-25 NOTE — Telephone Encounter (Signed)
Clinical questions have been submitted-awaiting determination. 

## 2022-09-25 NOTE — Telephone Encounter (Signed)
PA request has been Submitted. New Encounter created for follow up. For additional info see Pharmacy Prior Auth telephone encounter from 09/25/2022.

## 2022-09-25 NOTE — Telephone Encounter (Signed)
Pharmacy Patient Advocate Encounter   Received notification from Physician's Office that prior authorization for Venlafaxine HCl ER 225MG  er tablets is required/requested.   Insurance verification completed.   The patient is insured through [facility] .   Per test claim: PA started via CoverMyMeds. KEY [id number] . Waiting for clinical questions to populate.

## 2022-09-26 ENCOUNTER — Other Ambulatory Visit (HOSPITAL_COMMUNITY): Payer: Self-pay

## 2022-09-26 NOTE — Telephone Encounter (Signed)
65Pharmacy Patient Advocate Encounter  Received notification from [employer] that Prior Authorization for Venlafaxine HCl ER 225MG  er tabletshas been APPROVED from 09/25/2022 to 09/25/2023.Marland Kitchen  PA #/Case ID/Reference #: PA Case ID #: [id number]  Copay is $4.50 for 90DS/90 tablets per [facility] test claim.

## 2022-09-26 NOTE — Telephone Encounter (Signed)
Noted PA  approved.

## 2022-10-13 ENCOUNTER — Other Ambulatory Visit: Payer: Self-pay | Admitting: Neurology

## 2022-10-14 ENCOUNTER — Encounter: Payer: Self-pay | Admitting: Neurology

## 2022-10-14 NOTE — Telephone Encounter (Signed)
Last seen on 04/23/22 Follow up scheduled on 11/14/22

## 2022-11-12 ENCOUNTER — Other Ambulatory Visit: Payer: Self-pay

## 2022-11-12 MED ORDER — METHYLPHENIDATE HCL 10 MG PO TABS: ORAL_TABLET | ORAL | 0 refills | Status: DC

## 2022-11-12 NOTE — Progress Notes (Deleted)
No chief complaint on file.   HISTORY OF PRESENT ILLNESS:  11/12/22 ALL:  Kellie [name] is a 62 y.o. female here today for follow up for RRMS. She continues Tysabri infusions. MRI brain and cervical spine stable 02/2020. JCV 08/2021 0.11, negative.   She feels that she is doing fairly well from an MS standpoint. She has been going to [place] for an hour a day five days a week. She wears a left knee and left ankle brace at times. Multiple falls since 02/2021. She blames falls on her [place] dog. She also has difficulty with steps on occasion.   Gabapentin 600-1200mg  daily (usually 600 daily) and lamotrigine 100mg  BID help with dysesthesia.   Valium 5mg  BID (usually takes 5mg  daily at bedtime for insomnia)  She continues methylphenidate 10-20mg  BID for fatigue. She also takes venlafaxine 225mg  daily. She does feel that it helps with irritability. She takes alprazolam 0.25mg  for emergency situations. She rarely takes this.   Losartan recently filled by Dr Epimenio Foot for BP control. She is seeing Dr Aneta Mins with [facility].   She continues Mybetriq 50mg  daily, oxybutinin 24hr 10mg  daily and desmopressin 0.1mg  daily. She is also on nitrofuratonin daly and cipro as needed. She self caths. She is followed by urology regularly.   HISTORY (copied from Dr Bonnita Hollow previous note)  Kellie [name] is a 62 y.o. woman with relapsing remitting MS diagnoses in 1997.   Update 02/20/2021: She feels her MS is stable and she continues on Tysabri.    Her last JCV Ab was 08/09/2020 (0.13 negative).       She continues to have falls, mostly due to her left leg and ankle weakness.   She has a left ankle brace.  She has weakness in both legs.   She has some dysesthesias.   Gabapentin and lamotrigine help the dysesthesias.     She has urinary hesitancy and self caths up to 10 times a day.   For spasms she takes Myrbetriq, oxybutynin.  At night she takes desmopressin which helps her reduce the  frequency of catheterization so she can sleep better.   Due to frequent UTI, she is on nitrofurantoin.     She has left knee pain and has had fluid drained, steroid injections and Synvisc(or similar).   She is trying to avoid surgery.    She wears a brace on that side.  Mood is better since getting a puppy (she had lost her dog of 13 years).  She is on Effexor and lamotrigine.   Fatigue and attention deficit are better with Ritalin.    She takes valium 10 mg nightly and is sleeping well now.   She has osteoporosis (last checked in 05/2019).   She stopped alendronate a couple years ago.   She fractured her hip in 2011.     She has her Covid 19 vaccinations.  She had Covid-19 Omicron in January 2022.  She feels she never go to her baseline.   She notes having more difficulty with Jazzercise so taking fewer classes now.      MS HISTORY:   She presented with left sided numbness in September 1997.   She underwent an MRI and a lumbar puncture and was told MS was unlikely.   She had right sided numbness in January 1998 and had brain and spine MRI.    There were changes suggestive of MS and she was diagnosed in January 1998.   She was started on Betaseron.  She was on Betaseron x 10 years and then switched to Avonex for a year due to tolerability issues.   She had clinical exacerbations and MRI changes and then went on Tysabri in 2007 or 2008 and has had 92 infusions, her last infusion being 03/02/15.    She is transferring care from [facility] to [facility].  She tolerates Tysabri well.   She is JCV Ab negative.     An MRI of the spinal cord dated 02/02/2013 shows demyelinating plaque to the left adjacent to C3, anteriorly adjacent to C4, to the right adjacent to C5-C6, posteriorly to the right adjacent to C6-C7. When that study was compared to an MRI dated 12/29/2006, there was no definite interval change. MRI of the brain the same day was abnormal showing signal changes compatible with the diagnosis of  multiple sclerosis. Compared to the prior study dated 02/23/2012, there was no definite interval change.      MRI brain and cervical spine (09/2014) showing 4 cervical spine foci and typical demyelination plaques in brain (brain unchanged compared to 2012)..    She had MRI 05/2016 after a fall.  Repeat MRI of the brain and cervical spine 03/20/2017 did not show any new lesions compared to the 2016 MRIs.  STUDIES MRI brain 02/25/20 showed T2 hyperintense foci within the hemispheres and cerebellum in a pattern and configuration consistent with chronic demyelinating plaque associated with multiple sclerosis.  None of the foci appear to be acute.  Compared to the MRI dated 03/20/2017, there are no new lesions.  MRI cervical spine 02/25/2020 showed multiple T2 hyperintense foci within the spinal cord consistent with chronic demyelination associated with multiple sclerosis.  None of the foci appear to be acute and there were no new lesions compared to the 03/20/2017 MRI.    Multilevel degenerative changes    There is no spinal stenosis.  Foraminal narrowing is noted at several levels but there does not appear to be nerve root compression.  MRI cervical spine 03/21/2017 showed five T2 hyperintense foci within the spinal cord consistent with chronic demyelinating plaque associated with multiple sclerosis. None of these appear to be acute and they did not enhance. When compared to the MRI dated 08/04/2014, there is no interval change.    Multilevel degenerative changes as detailed above. The degenerative changes at C6-C7 have progressed when compared to the previous MRI.  There does not appear to be any definite nerve root compression and there is no spinal stenosis or spinal cord compression.  MRI brain 03/20/2017 multiple T2/FLAIR hyperintense foci in the white matter of the hemispheres and cerebellum in a pattern and configuration consistent with chronic demyelinating plaque associated with multiple sclerosis. None of  the foci appears to be acute. When compared to the MRI dated 08/17/2014, there is no interim change.     There is a normal enhancement pattern and there are no acute findings.   Bone density 06/02/2019 showed The BMD measured at AP Spine L1-L3 is 0.808 g/cm2 with a T-score of -3.0.   Patient is considered OSTEOPOROTIC according to World Health Organization [facility]) criteria.   REVIEW OF SYSTEMS: Out of a complete 14 system review of symptoms, the patient complains only of the following symptoms, chronic pain, urinary frequency, incontinence, insomnia, anxiety, and all other reviewed systems are negative.   ALLERGIES: Allergies  Allergen Reactions   Erythromycin Nausea And Vomiting   Clindamycin/Lincomycin    Dilaudid [Hydromorphone Hcl] Other (See Comments)    Hallucinations   Sulfa  Antibiotics      HOME MEDICATIONS: Outpatient Medications Prior to Visit  Medication Sig Dispense Refill   ALPRAZolam (XANAX) 0.25 MG tablet Take 1 tablet (0.25 mg total) by mouth at bedtime as needed for anxiety. TAKE 1 TABLET BY MOUTH AT BEDTIME AS NEEDED FOR ANXIETY 30 tablet 0   Ascorbic Acid (VITAMIN C PO) Take 500 mg by mouth daily.     Calcium Carbonate-Vitamin D 600-125 MG-UNIT TABS Take by mouth.     cetirizine (ZYRTEC) 1 MG/ML syrup Take 5 mg by mouth.     Cholecalciferol (VITAMIN D3 PO) Take 1,000 Units by mouth daily.     ciprofloxacin (CIPRO) 250 MG tablet Take 1 tablet (250 mg total) by mouth daily as needed. 14 tablet 0   desmopressin (DDAVP) 0.1 MG tablet TAKE 1 TABLET BY MOUTH EVERY DAY 90 tablet 3   diazepam (VALIUM) 5 MG tablet TAKE 1 TABLET BY MOUTH TWICE A DAY AS NEEDED 180 tablet 0   famciclovir (FAMVIR) 250 MG tablet TAKE 1 TABLET BY MOUTH TWICE A DAY     Fish Oil-Cholecalciferol (FISH OIL + D3) 1000-1000 MG-UNIT CAPS Take 3,600 mg by mouth daily.      gabapentin (NEURONTIN) 600 MG tablet TAKE 1 TO 2 TABLETS BY MOUTH DAILY 180 tablet 3   lamoTRIgine (LAMICTAL) 100 MG tablet TAKE 1  TABLET BY MOUTH TWICE A DAY 180 tablet 0   losartan (COZAAR) 50 MG tablet TAKE 1 TABLET BY MOUTH EVERY DAY 90 tablet 0   methylphenidate (RITALIN) 10 MG tablet Takes 2 tablets in the morning and 1 tablet midday 90 tablet 0   MYRBETRIQ 50 MG TB24 tablet TAKE 1 TABLET ONCE DAILY. (Patient taking differently: Take 50 mg by mouth 2 (two) times daily.) 30 tablet 6   natalizumab (TYSABRI) 300 MG/15ML injection Inject into the vein.     natalizumab 300 mg in sodium chloride 0.9 % 100 mL Inject into the vein.     nitrofurantoin (MACRODANTIN) 100 MG capsule Take 1 capsule (100 mg total) by mouth at bedtime. 90 capsule 0   omeprazole (PRILOSEC) 20 MG capsule 2 (two) times daily.     oxybutynin (DITROPAN-XL) 10 MG 24 hr tablet TAKE 1 TABLET BY MOUTH DAILY. AS DIRECTED 90 tablet 0   simvastatin (ZOCOR) 40 MG tablet TAKE 1 TABLET AT BEDTIME FOR CHOLESTEROL     Teriparatide, Recombinant, (FORTEO) 600 MCG/2.4ML SOPN Inject into the skin.     Venlafaxine HCl 225 MG TB24 TAKE 1 TABLET BY MOUTH EVERY DAY 90 tablet 0   vitamin B-12 (CYANOCOBALAMIN) 1000 MCG tablet Take 2,500 mcg by mouth daily.      Wheat Dextrin (BENEFIBER) POWD Take 1 Dose by mouth every morning.     No facility-administered medications prior to visit.     PAST MEDICAL HISTORY: Past Medical History:  Diagnosis Date   Chronic kidney disease    Complication of anesthesia    pt states she got thrush after anesthesia   Multiple sclerosis (HCC)    Neuropathy    Osteoarthritis    Vision abnormalities      PAST SURGICAL HISTORY: Past Surgical History:  Procedure Laterality Date   CARPAL TUNNEL RELEASE Right    2010   CARPAL TUNNEL RELEASE Left    CESAREAN SECTION     COLONOSCOPY  07/26/14   ENDOMETRIAL ABLATION     2007   HIP FRACTURE SURGERY     hysterctomy     TUBAL LIGATION  FAMILY HISTORY: Family History  Problem Relation Age of Onset   Lung cancer Mother    Dementia Father    Diverticulitis Father     Pancreatitis Father    Breast cancer Cousin      SOCIAL HISTORY: Social History   Socioeconomic History   Marital status: Significant Other    Spouse name: Not on file   Number of children: Not on file   Years of education: Not on file   Highest education level: Not on file  Occupational History   Not on file  Tobacco Use   Smoking status: Former    Current packs/day: 0.00    Types: Cigarettes    Quit date: 03/29/1981    Years since quitting: 41.6   Smokeless tobacco: Never  Substance and Sexual Activity   Alcohol use: Yes    Alcohol/week: 0.0 standard drinks of alcohol    Comment: Occasional   Drug use: No   Sexual activity: Not on file  Other Topics Concern   Not on file  Social History Narrative   Not on file   Social Determinants of Health   Financial Resource Strain: Low Risk  (11/07/2022)   Received from [facility]   Overall Financial Resource Strain (CARDIA)    Difficulty of Paying Living Expenses: Not very hard  Food Insecurity: No Food Insecurity (11/07/2022)   Received from [facility]   Hunger Vital Sign    Worried About Running Out of Food in the Last Year: Never true    Ran Out of Food in the Last Year: Never true  Recent Concern: Food Insecurity - Food Insecurity Present (11/05/2022)   Received from [facility]   Hunger Vital Sign    Worried About Running Out of Food in the Last Year: Sometimes true    Ran Out of Food in the Last Year: Sometimes true  Transportation Needs: No Transportation Needs (11/07/2022)   Received from [facility] - Transportation    Lack of Transportation (Medical): No    Lack of Transportation (Non-Medical): No  Physical Activity: Sufficiently Active (11/07/2022)   Received from [facility]   Exercise Vital Sign    Days of Exercise per Week: 5 days    Minutes of Exercise per Session: 60 min  Stress: Stress Concern Present (11/07/2022)   Received from [facility] - Occupational Stress Questionnaire    Feeling of Stress : To some extent  Social Connections: Socially Integrated (11/07/2022)   Received from [facility]   Social Network    How would you rate your social network (family, work, friends)?: Good participation with social networks  Intimate Partner Violence: Not At Risk (11/07/2022)   Received from [facility]   HITS    Over the last 12 months how often did your partner physically hurt you?: 1    Over the last 12 months how often did your partner insult you or talk down to you?: 1    Over the last 12 months how often did your partner threaten you with physical harm?: 1    Over the last 12 months how often did your partner scream or curse at you?: 1     PHYSICAL EXAM  There were no vitals filed for this visit.  There is no height or weight on file to calculate BMI.  Generalized: Well developed, in no acute distress  Cardiology: normal rate and rhythm, no murmur auscultated  Respiratory: clear  to auscultation bilaterally    Neurological examination  Mentation: Alert oriented to time, place, history taking. Follows all commands speech and language fluent Cranial nerve II-XII: Pupils were equal round reactive to light. Extraocular movements were full, visual field were full on confrontational test. Facial sensation and strength were normal. Head turning and shoulder shrug  were normal and symmetric. Motor: The motor testing reveals 5 over 5 strength of all 4 extremities. Good symmetric motor tone is noted throughout.  Sensory: Sensory testing is intact to soft touch on all 4 extremities. No evidence of extinction is noted.  Coordination: Cerebellar testing reveals good finger-nose-finger and heel-to-shin bilaterally.  Gait and station: Gait is arthritic, stable without assistive device. Tandem very unsteady.  Reflexes: Deep tendon reflexes are symmetric and normal bilaterally.    DIAGNOSTIC DATA (LABS, IMAGING, TESTING) - I  reviewed patient records, labs, notes, testing and imaging myself where available.  Lab Results  Component Value Date   WBC 6.3 08/21/2022   HGB 10.7 (L) 08/21/2022   HCT 31.9 (L) 08/21/2022   MCV 91 08/21/2022   PLT 250 08/21/2022      Component Value Date/Time   PROT 6.2 08/15/2021 1206   ALBUMIN 4.3 08/15/2021 1206   AST 18 08/15/2021 1206   ALT 20 08/15/2021 1206   ALKPHOS 108 08/15/2021 1206   BILITOT 0.8 08/15/2021 1206   Lab Results  Component Value Date   CHOL 136 09/29/2007   HDL 41.5 09/29/2007   LDLCALC 69 09/29/2007   TRIG 126 09/29/2007   CHOLHDL 3.3 CALC 09/29/2007   No results found for: "HGBA1C" No results found for: "VITAMINB12" No results found for: "TSH"      No data to display               No data to display           ASSESSMENT AND PLAN  62 y.o. year old female  has a past medical history of Chronic kidney disease, Complication of anesthesia, Multiple sclerosis (HCC), Neuropathy, Osteoarthritis, and Vision abnormalities. here with    No diagnosis found.  Kellie [name] is doing well, today. She is tolerating Tysabri. Labs have been stable. We will continue current treatment. Continue meds as prescribed. PDMP appropriate. Healthy lifestyle habits encouraged. She will follow up with PCP as directed. She will return to see Dr Epimenio Foot in 6 months, sooner if needed. She verbalizes understanding and agreement with this plan.   No orders of the defined types were placed in this encounter.    No orders of the defined types were placed in this encounter.    Shawnie Dapper, MSN, FNP-C 11/12/2022, 7:41 PM  [facility] [address], [address] [place], [place] [address] [phone]

## 2022-11-14 ENCOUNTER — Ambulatory Visit: Payer: Medicare Other | Admitting: Family Medicine

## 2022-11-14 ENCOUNTER — Telehealth: Payer: Self-pay | Admitting: Family Medicine

## 2022-11-14 ENCOUNTER — Ambulatory Visit
Admission: RE | Admit: 2022-11-14 | Discharge: 2022-11-14 | Disposition: A | Discharge status: Home / Self Care | Payer: Medicare Other | Source: Ambulatory Visit | Attending: Family Medicine | Admitting: Family Medicine

## 2022-11-14 DIAGNOSIS — G35 Multiple sclerosis: Secondary | ICD-10-CM

## 2022-11-14 DIAGNOSIS — M810 Age-related osteoporosis without current pathological fracture: Secondary | ICD-10-CM

## 2022-11-14 DIAGNOSIS — Z79899 Other long term (current) drug therapy: Secondary | ICD-10-CM

## 2022-11-14 NOTE — Telephone Encounter (Signed)
LVM and sent mychart msg informing pt of need to reschedule 11/14/22 - office experiencing electrical issues

## 2022-11-21 ENCOUNTER — Telehealth: Payer: Self-pay | Admitting: *Deleted

## 2022-11-21 NOTE — Telephone Encounter (Signed)
Pt got last fill 11-17-2022.  She will need PA to resume in Oct 2024.  Please assist.  ID# [id number] BIN [id number] PCN [id number]  GRP [id number]  Thank you

## 2022-11-22 ENCOUNTER — Other Ambulatory Visit (HOSPITAL_COMMUNITY): Payer: Self-pay

## 2022-11-22 ENCOUNTER — Telehealth: Payer: Self-pay

## 2022-11-22 NOTE — Telephone Encounter (Signed)
Pharmacy Patient Advocate Encounter   Received notification from Physician's Office that prior authorization for Methylphenidate HCl 10MG  tablets is required/requested.   Insurance verification completed.   The patient is insured through [facility] .   Per test claim: PA required; PA started via CoverMyMeds. KEY [id number] . Waiting for clinical questions to populate.

## 2022-11-26 NOTE — Telephone Encounter (Signed)
PA request has been Submitted. New Encounter created for follow up. For additional info see Pharmacy Prior Auth telephone encounter from 11/22/2022.

## 2022-11-29 NOTE — Telephone Encounter (Signed)
Clinical questions have been submitted. Awaiting determination.

## 2022-12-02 ENCOUNTER — Other Ambulatory Visit (HOSPITAL_COMMUNITY): Payer: Self-pay

## 2022-12-02 ENCOUNTER — Encounter: Payer: Self-pay | Admitting: Neurology

## 2022-12-02 DIAGNOSIS — M4317 Spondylolisthesis, lumbosacral region: Secondary | ICD-10-CM

## 2022-12-02 DIAGNOSIS — M545 Low back pain, unspecified: Secondary | ICD-10-CM

## 2022-12-02 NOTE — Telephone Encounter (Signed)
Pharmacy Patient Advocate Encounter  Received notification from [facility] that Prior Authorization for Methylphenidate HCl 10MG  tablets has been APPROVED from 11/30/2022 to 11/29/2023. Ran test claim, Copay is $4.50. This test claim was processed through [facility]- copay amounts may vary at other pharmacies due to pharmacy/plan contracts, or as the patient moves through the different stages of their insurance plan.   PA #/Case ID/Reference #: A Case ID #: [id number]

## 2022-12-02 NOTE — Telephone Encounter (Signed)
Please let patient know her medication has been approved.

## 2022-12-04 ENCOUNTER — Other Ambulatory Visit: Payer: Self-pay | Admitting: Neurology

## 2022-12-09 ENCOUNTER — Telehealth: Payer: Self-pay | Admitting: Neurology

## 2022-12-12 ENCOUNTER — Encounter: Payer: Self-pay | Admitting: Family Medicine

## 2022-12-12 ENCOUNTER — Ambulatory Visit: Payer: Medicare Other | Admitting: Family Medicine

## 2022-12-12 VITALS — BP 119/59 | HR 77 | Ht 62.0 in | Wt 140.5 lb

## 2022-12-12 DIAGNOSIS — Z79899 Other long term (current) drug therapy: Secondary | ICD-10-CM

## 2022-12-12 DIAGNOSIS — G8929 Other chronic pain: Secondary | ICD-10-CM

## 2022-12-12 DIAGNOSIS — R5383 Other fatigue: Secondary | ICD-10-CM

## 2022-12-12 DIAGNOSIS — G35 Multiple sclerosis: Secondary | ICD-10-CM | POA: Diagnosis not present

## 2022-12-12 DIAGNOSIS — R269 Unspecified abnormalities of gait and mobility: Secondary | ICD-10-CM | POA: Diagnosis not present

## 2022-12-12 DIAGNOSIS — F32A Depression, unspecified: Secondary | ICD-10-CM

## 2022-12-12 DIAGNOSIS — R208 Other disturbances of skin sensation: Secondary | ICD-10-CM

## 2022-12-12 DIAGNOSIS — G35D Multiple sclerosis, unspecified: Secondary | ICD-10-CM

## 2022-12-12 DIAGNOSIS — G4701 Insomnia due to medical condition: Secondary | ICD-10-CM

## 2022-12-12 DIAGNOSIS — M545 Low back pain, unspecified: Secondary | ICD-10-CM | POA: Diagnosis not present

## 2022-12-12 DIAGNOSIS — F988 Other specified behavioral and emotional disorders with onset usually occurring in childhood and adolescence: Secondary | ICD-10-CM

## 2022-12-12 DIAGNOSIS — Z87718 Personal history of other specified (corrected) congenital malformations of genitourinary system: Secondary | ICD-10-CM

## 2022-12-12 NOTE — Progress Notes (Signed)
Chief Complaint  Patient presents with   Follow-up    Pt in room 1, alone here for MS follow up,on Tysabri.     HISTORY OF PRESENT ILLNESS:  12/12/22 ALL:  Kellie [name] is a 62 y.o. female here today for follow up for RRMS. She continues Tysabri infusions. Last infusion yesterday. MRI brain stable 05/2022. JCV 08/2022 0.12, negative. CBC 9/24 stable.   She feels that she is doing fairly well from an MS standpoint. Gait is stable. She does report continued falls. She is unable to give me any idea of how often but states she "falls all the time" She has been going to [place] for an hour a day five days a week. She wears a left knee and left ankle brace at times. She does not seem to have difficulty with falls when exercising. She blames some alls on her [place] dog. She also has difficulty with steps on occasion. Feels feet get caught up. She was working with PT but had to delay visits due to her aunt being sick. She has next visit next week.   Gabapentin 600-1200mg  daily (usually 600 daily) and lamotrigine 100mg  BID help with dysesthesia. She continues to have LBP. She had facet injections 11/2022 but did not feel it was beneficial. She had complete numbness of the right leg afterwards that was resolved the next morning. She has been followed by [facility] pain management but reports she does not plan to return.   She continues methylphenidate 10-20mg  BID for fatigue. She also takes venlafaxine 225mg  daily. She does feel that it helps with irritability. She takes alprazolam 0.25mg  for emergency situations. She rarely takes this. She is sleeping well. Valium 5mg  BID helps (usually takes 5mg  daily at bedtime for insomnia).  Losartan recently filled by Dr Epimenio Foot for BP control. She is seeing Dr Aneta Mins with [facility].   She continues Mybetriq 50mg  daily, oxybutinin 24hr 10mg  daily and desmopressin 0.1mg  daily. She is also on nitrofuratonin daly and cipro as needed. She self  caths. She is followed by urology regularly.   HISTORY (copied from Dr Bonnita Hollow previous note)  Kellie [name] is a 62 y.o. woman with relapsing remitting MS diagnoses in 1997.    Update 04/23/2022: She feels her MS is mostly stable and she continues on Tysabri.    She has no exacerbations but notes some worsening symptoms.       Her last JCV Ab was 03/06/2022 (0.12 negative).    She fell and has a bruise on her right shoulder and also hit her head.   She has a left ankle brace.  She has weakness in both legs.   She has some dysesthesias.   Gabapentin and lamotrigine help the dysesthesias.     She has urinary hesitancy and self caths up to 10 times a day.   For spasms she takes Myrbetriq, oxybutynin.  At night she takes desmopressin which helps her reduce the frequency of catheterization so she can sleep better.   Due to frequent UTI, she is on nitrofurantoin.      LBP is worse.   She saw a Pain Management doctor at [facility] and had a lumbar xray sowing Mild anterior spondylolisthesis of L4 on L5.  Minimal anterior spondylolisthesis of L3 on L4.    Slight retrolisthesis of L2 on L3.  Multilevel degenerative changes in the lumbar spine, most pronounced at L4-5 and L5-S1.   She will be starting PT.   She has left knee  pain.  An injection had not helped,     She is trying to avoid surgery.    She wears a brace on that side.   Mood is better since getting a puppy (she had lost her dog of 13 years).  She is on Effexor and lamotrigine.   Fatigue and attention deficit are better with Ritalin.    She takes valium 10 mg nightly and is sleeping well now.    She has osteoporosis (last checked in 05/2019).   She stopped alendronate a couple years ago and is now on Forteo.   She fractured her hip in 2011.          MS HISTORY:   She presented with left sided numbness in September 1997.   She underwent an MRI and a lumbar puncture and was told MS was unlikely.   She had right sided numbness in January 1998 and had  brain and spine MRI.    There were changes suggestive of MS and she was diagnosed in January 1998.   She was started on Betaseron.   She was on Betaseron x 10 years and then switched to Avonex for a year due to tolerability issues.   She had clinical exacerbations and MRI changes and then went on Tysabri in 2007 or 2008 and has had 92 infusions, her last infusion being 03/02/15.    She is transferring care from [facility] to [facility].  She tolerates Tysabri well.   She is JCV Ab negative.     An MRI of the spinal cord dated 02/02/2013 shows demyelinating plaque to the left adjacent to C3, anteriorly adjacent to C4, to the right adjacent to C5-C6, posteriorly to the right adjacent to C6-C7. When that study was compared to an MRI dated 12/29/2006, there was no definite interval change. MRI of the brain the same day was abnormal showing signal changes compatible with the diagnosis of multiple sclerosis. Compared to the prior study dated 02/23/2012, there was no definite interval change.      MRI brain and cervical spine (09/2014) showing 4 cervical spine foci and typical demyelination plaques in brain (brain unchanged compared to 2012)..    She had MRI 05/2016 after a fall.  Repeat MRI of the brain and cervical spine 03/20/2017 did not show any new lesions compared to the 2016 MRIs.   STUDIES MRI brain 02/25/20 showed T2 hyperintense foci within the hemispheres and cerebellum in a pattern and configuration consistent with chronic demyelinating plaque associated with multiple sclerosis.  None of the foci appear to be acute.  Compared to the MRI dated 03/20/2017, there are no new lesions.   MRI cervical spine 02/25/2020 showed multiple T2 hyperintense foci within the spinal cord consistent with chronic demyelination associated with multiple sclerosis.  None of the foci appear to be acute and there were no new lesions compared to the 03/20/2017 MRI.    Multilevel degenerative changes    There is no spinal stenosis.   Foraminal narrowing is noted at several levels but there does not appear to be nerve root compression.   MRI cervical spine 03/21/2017 showed five T2 hyperintense foci within the spinal cord consistent with chronic demyelinating plaque associated with multiple sclerosis. None of these appear to be acute and they did not enhance. When compared to the MRI dated 08/04/2014, there is no interval change.    Multilevel degenerative changes as detailed above. The degenerative changes at C6-C7 have progressed when compared to the previous MRI.  There  does not appear to be any definite nerve root compression and there is no spinal stenosis or spinal cord compression.   MRI brain 03/20/2017 multiple T2/FLAIR hyperintense foci in the white matter of the hemispheres and cerebellum in a pattern and configuration consistent with chronic demyelinating plaque associated with multiple sclerosis. None of the foci appears to be acute. When compared to the MRI dated 08/17/2014, there is no interim change.     There is a normal enhancement pattern and there are no acute findings.    Bone density 06/02/2019 showed The BMD measured at AP Spine L1-L3 is 0.808 g/cm2 with a T-score of -3.0.   Patient is considered OSTEOPOROTIC according to World Health Organization [facility]) criteria.   REVIEW OF SYSTEMS: Out of a complete 14 system review of symptoms, the patient complains only of the following symptoms, chronic pain, urinary frequency, incontinence, insomnia, anxiety, and all other reviewed systems are negative.   ALLERGIES: Allergies  Allergen Reactions   Erythromycin Nausea And Vomiting   Clindamycin/Lincomycin    Dilaudid [Hydromorphone Hcl] Other (See Comments)    Hallucinations   Sulfa Antibiotics      HOME MEDICATIONS: Outpatient Medications Prior to Visit  Medication Sig Dispense Refill   ALPRAZolam (XANAX) 0.25 MG tablet Take 1 tablet (0.25 mg total) by mouth at bedtime as needed for anxiety. TAKE 1 TABLET BY  MOUTH AT BEDTIME AS NEEDED FOR ANXIETY 30 tablet 0   Calcium Carbonate-Vitamin D 600-125 MG-UNIT TABS Take by mouth.     cetirizine (ZYRTEC) 1 MG/ML syrup Take 5 mg by mouth.     Cholecalciferol (VITAMIN D3 PO) Take 1,000 Units by mouth daily.     ciprofloxacin (CIPRO) 250 MG tablet Take 1 tablet (250 mg total) by mouth daily as needed. 14 tablet 0   desmopressin (DDAVP) 0.1 MG tablet TAKE 1 TABLET BY MOUTH EVERY DAY 90 tablet 3   diazepam (VALIUM) 5 MG tablet TAKE 1 TABLET BY MOUTH TWICE A DAY AS NEEDED 180 tablet 0   famciclovir (FAMVIR) 250 MG tablet TAKE 1 TABLET BY MOUTH TWICE A DAY     Fish Oil-Cholecalciferol (FISH OIL + D3) 1000-1000 MG-UNIT CAPS Take 3,600 mg by mouth daily.      gabapentin (NEURONTIN) 600 MG tablet TAKE 1 TO 2 TABLETS BY MOUTH DAILY 180 tablet 3   lamoTRIgine (LAMICTAL) 100 MG tablet TAKE 1 TABLET BY MOUTH TWICE A DAY 180 tablet 0   losartan (COZAAR) 50 MG tablet TAKE 1 TABLET BY MOUTH EVERY DAY 90 tablet 0   methylphenidate (RITALIN) 10 MG tablet Takes 2 tablets in the morning and 1 tablet midday 90 tablet 0   MYRBETRIQ 50 MG TB24 tablet TAKE 1 TABLET ONCE DAILY. (Patient taking differently: Take 50 mg by mouth 2 (two) times daily.) 30 tablet 6   natalizumab (TYSABRI) 300 MG/15ML injection Inject into the vein.     natalizumab 300 mg in sodium chloride 0.9 % 100 mL Inject into the vein.     nitrofurantoin (MACRODANTIN) 100 MG capsule Take 1 capsule (100 mg total) by mouth at bedtime. 90 capsule 0   omeprazole (PRILOSEC) 20 MG capsule 2 (two) times daily.     oxybutynin (DITROPAN-XL) 10 MG 24 hr tablet TAKE 1 TABLET BY MOUTH DAILY. AS DIRECTED 90 tablet 0   simvastatin (ZOCOR) 40 MG tablet TAKE 1 TABLET AT BEDTIME FOR CHOLESTEROL     Venlafaxine HCl 225 MG TB24 TAKE 1 TABLET BY MOUTH EVERY DAY 90 tablet 0  vitamin B-12 (CYANOCOBALAMIN) 1000 MCG tablet Take 2,500 mcg by mouth daily.      Ascorbic Acid (VITAMIN C PO) Take 500 mg by mouth daily.     Teriparatide,  Recombinant, (FORTEO) 600 MCG/2.4ML SOPN Inject into the skin.     Wheat Dextrin (BENEFIBER) POWD Take 1 Dose by mouth every morning.     No facility-administered medications prior to visit.     PAST MEDICAL HISTORY: Past Medical History:  Diagnosis Date   Chronic kidney disease    Complication of anesthesia    pt states she got thrush after anesthesia   Multiple sclerosis (HCC)    Neuropathy    Osteoarthritis    Vision abnormalities      PAST SURGICAL HISTORY: Past Surgical History:  Procedure Laterality Date   CARPAL TUNNEL RELEASE Right    2010   CARPAL TUNNEL RELEASE Left    CESAREAN SECTION     COLONOSCOPY  07/26/14   ENDOMETRIAL ABLATION     2007   HIP FRACTURE SURGERY     hysterctomy     TUBAL LIGATION       FAMILY HISTORY: Family History  Problem Relation Age of Onset   Lung cancer Mother    Dementia Father    Diverticulitis Father    Pancreatitis Father    Breast cancer Cousin      SOCIAL HISTORY: Social History   Socioeconomic History   Marital status: Significant Other    Spouse name: Not on file   Number of children: Not on file   Years of education: Not on file   Highest education level: Not on file  Occupational History   Not on file  Tobacco Use   Smoking status: Former    Current packs/day: 0.00    Types: Cigarettes    Quit date: 03/29/1981    Years since quitting: 41.7   Smokeless tobacco: Never  Substance and Sexual Activity   Alcohol use: Yes    Alcohol/week: 0.0 standard drinks of alcohol    Comment: Occasional   Drug use: No   Sexual activity: Not on file  Other Topics Concern   Not on file  Social History Narrative   Not on file   Social Determinants of Health   Financial Resource Strain: Low Risk  (11/07/2022)   Received from [facility]   Overall Financial Resource Strain (CARDIA)    Difficulty of Paying Living Expenses: Not very hard  Food Insecurity: No Food Insecurity (11/07/2022)   [facility] from [facility]    Hunger Vital Sign    Worried About Running Out of Food in the Last Year: Never true    Ran Out of Food in the Last Year: Never true  Recent Concern: Food Insecurity - Food Insecurity Present (11/05/2022)   Received from [facility]   Hunger Vital Sign    Worried About Running Out of Food in the Last Year: Sometimes true    Ran Out of Food in the Last Year: Sometimes true  Transportation Needs: No Transportation Needs (11/07/2022)   Received from [facility] - Transportation    Lack of Transportation (Medical): No    Lack of Transportation (Non-Medical): No  Physical Activity: Sufficiently Active (11/07/2022)   Received from [facility]   Exercise Vital Sign    Days of Exercise per Week: 5 days    Minutes of Exercise per Session: 60 min  Stress: Stress Concern Present (11/07/2022)   Received from [facility]  [employer] of Occupational Health - Occupational Stress Questionnaire    Feeling of Stress : To some extent  Social Connections: Socially Integrated (11/07/2022)   Received from [facility]   Social Network    How would you rate your social network (family, work, friends)?: Good participation with social networks  Intimate Partner Violence: Not At Risk (11/07/2022)   Received from [facility]   HITS    Over the last 12 months how often did your partner physically hurt you?: 1    Over the last 12 months how often did your partner insult you or talk down to you?: 1    Over the last 12 months how often did your partner threaten you with physical harm?: 1    Over the last 12 months how often did your partner scream or curse at you?: 1     PHYSICAL EXAM  Vitals:   12/12/22 1122  BP: (!) 119/59  Pulse: 77  Weight: 140 lb 8 oz (63.7 kg)  Height: 5\' 2"  (1.575 m)    Body mass index is 25.7 kg/m.  Generalized: Well developed, in no acute distress  Cardiology: normal rate and rhythm, no murmur auscultated  Respiratory: clear to auscultation  bilaterally    Neurological examination  Mentation: Alert oriented to time, place, history taking. Follows all commands speech and language fluent Cranial nerve II-XII: Pupils were equal round reactive to light. Extraocular movements were full, visual field were full on confrontational test. Facial sensation and strength were normal. Head turning and shoulder shrug  were normal and symmetric. Motor: The motor testing reveals 5 over 5 strength of all 4 extremities. Good symmetric motor tone is noted throughout.  Sensory: Sensory testing is intact to soft touch on all 4 extremities. No evidence of extinction is noted.  Coordination: Cerebellar testing reveals good finger-nose-finger and heel-to-shin bilaterally.  Gait and station: Gait is arthritic, stable without assistive device. Tandem very unsteady.  Reflexes: Deep tendon reflexes are symmetric and normal bilaterally.    DIAGNOSTIC DATA (LABS, IMAGING, TESTING) - I reviewed patient records, labs, notes, testing and imaging myself where available.  Lab Results  Component Value Date   WBC 6.3 08/21/2022   HGB 10.7 (L) 08/21/2022   HCT 31.9 (L) 08/21/2022   MCV 91 08/21/2022   PLT 250 08/21/2022      Component Value Date/Time   PROT 6.2 08/15/2021 1206   ALBUMIN 4.3 08/15/2021 1206   AST 18 08/15/2021 1206   ALT 20 08/15/2021 1206   ALKPHOS 108 08/15/2021 1206   BILITOT 0.8 08/15/2021 1206   Lab Results  Component Value Date   CHOL 136 09/29/2007   HDL 41.5 09/29/2007   LDLCALC 69 09/29/2007   TRIG 126 09/29/2007   CHOLHDL 3.3 CALC 09/29/2007   No results found for: "HGBA1C" No results found for: "VITAMINB12" No results found for: "TSH"      No data to display               No data to display           ASSESSMENT AND PLAN  62 y.o. year old female  has a past medical history of Chronic kidney disease, Complication of anesthesia, Multiple sclerosis (HCC), Neuropathy, Osteoarthritis, and Vision abnormalities.  here with    Multiple sclerosis (HCC)  High risk medication use  Chronic midline low back pain without sciatica  Abnormality of gait  Other fatigue  History of urinary anomaly  Attention deficit disorder (ADD) without  hyperactivity  Depression, unspecified depression type  Dysesthesia  Insomnia due to medical condition  Kellie [name] is doing well, today. She is tolerating Tysabri. Labs have been stable. We will continue current treatment. Continue gabapentin 600-1200mg  daily and lamotrigine 100mg  BID for dysesthesias. Continue methylphenidate 10-20mg  daily for MS fatigue and ADD. Continue valium 5mg  up to BID for muscle spasms and insomnia. Continue Myrbetriq 50mg  BID, DDVAP 0.1mg  and oxybutynin 10mg  for bladder control. Continue nitrofurantoin 100mg  daily for UTI prevention. Continue venlafaxine 225mg  and alprazolam 0.25mg  as needed for mood management. PDMP appropriate. Healthy lifestyle habits encouraged. She will follow up with PCP as directed. Losartan can be filled by PCP. She will return to see Dr Epimenio Foot in 6 months, sooner if needed. She verbalizes understanding and agreement with this plan.   No orders of the defined types were placed in this encounter.    No orders of the defined types were placed in this encounter.    Shawnie Dapper, MSN, FNP-C 12/12/2022, 3:41 PM  [facility] [address], [address] [place], [place] [address] [phone]

## 2022-12-12 NOTE — Patient Instructions (Signed)

## 2022-12-20 ENCOUNTER — Encounter: Payer: Self-pay | Admitting: Neurology

## 2022-12-25 ENCOUNTER — Other Ambulatory Visit: Payer: Self-pay | Admitting: Neurology

## 2022-12-25 MED ORDER — METHYLPHENIDATE HCL 10 MG PO TABS: ORAL_TABLET | ORAL | 0 refills | Status: DC

## 2022-12-25 NOTE — Telephone Encounter (Signed)
Last seen on 12/12/22 Follow up scheduled on 05/29/23 Last filled on 11/17/22 #90 tablets (30 day supply) Rx pending to be signed

## 2022-12-31 ENCOUNTER — Other Ambulatory Visit: Payer: Self-pay | Admitting: Neurology

## 2023-01-01 NOTE — Telephone Encounter (Signed)
Last seen on 12/12/22 per note "  Losartan can be filled by PCP, She is seeing Dr Aneta Mins with [facility].    Follow up scheduled on 05/29/23

## 2023-01-06 ENCOUNTER — Encounter: Payer: Self-pay | Admitting: Neurology

## 2023-01-11 ENCOUNTER — Other Ambulatory Visit: Payer: Self-pay | Admitting: Neurology

## 2023-01-13 NOTE — Telephone Encounter (Signed)
Last seen on 12/12/22 per note "Continue valium 5mg  up to BID for muscle spasms and insomnia. " Follow up scheduled on 05/29/23 Last filled 09/05/22 #180 (90 day supply) Rx pending to be signed

## 2023-01-19 ENCOUNTER — Other Ambulatory Visit: Payer: Self-pay | Admitting: Neurology

## 2023-01-20 NOTE — Telephone Encounter (Signed)
Last seen on 12/12/22 per note "  Continue venlafaxine 225 mg" Follow up scheduled on 05/29/23

## 2023-02-11 ENCOUNTER — Other Ambulatory Visit: Payer: Self-pay | Admitting: Neurology

## 2023-02-12 MED ORDER — METHYLPHENIDATE HCL 10 MG PO TABS: ORAL_TABLET | ORAL | 0 refills | Status: DC

## 2023-02-12 NOTE — Telephone Encounter (Signed)
Last seen on 12/12/22 Follow up scheduled on 05/29/22 Last filled on 12/25/22 #90 tablets (30 day supply) Rx pending to be signed

## 2023-03-13 ENCOUNTER — Encounter: Payer: Self-pay | Admitting: Neurology

## 2023-03-13 ENCOUNTER — Telehealth: Payer: Self-pay | Admitting: *Deleted

## 2023-03-13 ENCOUNTER — Other Ambulatory Visit: Payer: Self-pay | Admitting: *Deleted

## 2023-03-13 ENCOUNTER — Other Ambulatory Visit: Payer: Self-pay

## 2023-03-13 DIAGNOSIS — Z79899 Other long term (current) drug therapy: Secondary | ICD-10-CM

## 2023-03-13 DIAGNOSIS — G35 Multiple sclerosis: Secondary | ICD-10-CM

## 2023-03-13 NOTE — Telephone Encounter (Signed)
 Placed JCV lab in [facility] lock box for routine lab pick up. Results pending.

## 2023-03-14 LAB — CBC WITH DIFFERENTIAL/PLATELET
Basophils Absolute: 0.1 10*3/uL (ref 0.0–0.2)
Basos: 1 %
EOS (ABSOLUTE): 0.4 10*3/uL (ref 0.0–0.4)
Eos: 6 %
Hematocrit: 36.2 % (ref 34.0–46.6)
Hemoglobin: 12.1 g/dL (ref 11.1–15.9)
Immature Grans (Abs): 0.1 10*3/uL (ref 0.0–0.1)
Immature Granulocytes: 2 %
Lymphocytes Absolute: 3 10*3/uL (ref 0.7–3.1)
Lymphs: 41 %
MCH: 31.1 pg (ref 26.6–33.0)
MCHC: 33.4 g/dL (ref 31.5–35.7)
MCV: 93 fL (ref 79–97)
Monocytes Absolute: 0.6 10*3/uL (ref 0.1–0.9)
Monocytes: 8 %
NRBC: 1 % — ABNORMAL HIGH (ref 0–0)
Neutrophils Absolute: 3 10*3/uL (ref 1.4–7.0)
Neutrophils: 42 %
Platelets: 242 10*3/uL (ref 150–450)
RBC: 3.89 x10E6/uL (ref 3.77–5.28)
RDW: 13.7 % (ref 11.7–15.4)
WBC: 7.1 10*3/uL (ref 3.4–10.8)

## 2023-03-18 NOTE — Telephone Encounter (Signed)
 I Geologist, engineering at [employer] for further assistance with this.

## 2023-03-19 NOTE — Telephone Encounter (Signed)
 Per Crystal at [employer]: Oh no ?  Since the patient has medicare, she is not eligible for assistance through our administration copay program.  She may want to reach out to [employer] to see if there is assistance they can offer. I'm sorry Kellie [name]! I know this is not a lot of help.

## 2023-03-24 IMAGING — MG MM DIGITAL SCREENING BILAT W/ TOMO AND CAD
8 series · 9 of 24 positions shown · non-contrast
Comparison: Previous exam(s).

CLINICAL DATA: Screening.

EXAM:
DIGITAL SCREENING BILATERAL MAMMOGRAM WITH TOMOSYNTHESIS AND CAD
TECHNIQUE: Bilateral screening digital craniocaudal and mediolateral oblique
mammograms were obtained. Bilateral screening digital breast
tomosynthesis was performed. The images were evaluated with
computer-aided detection.

[L CC synth-2D]
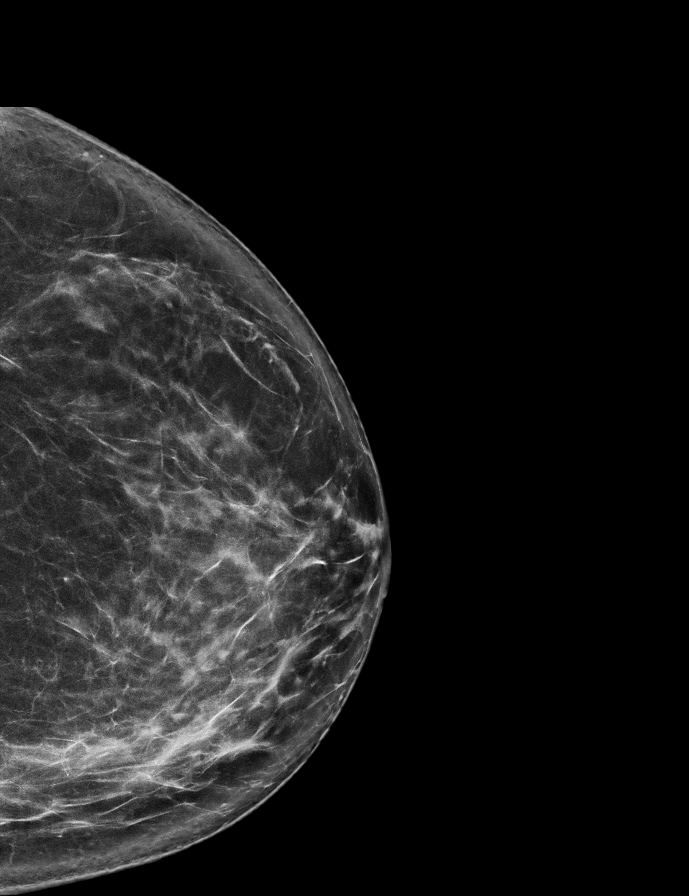

[R CC synth-2D]
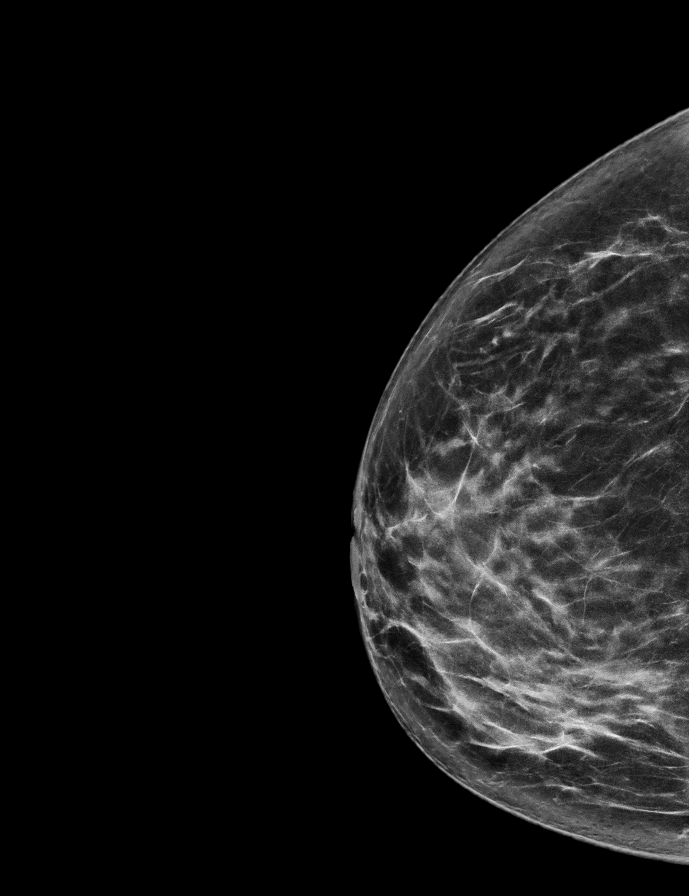

[R MLO synth-2D]
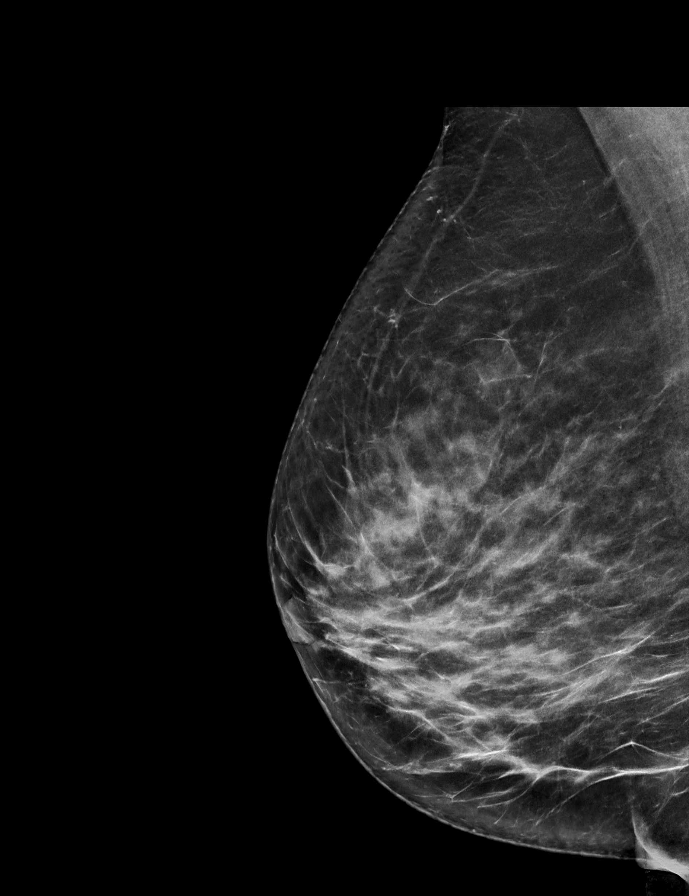

[L MLO synth-2D]
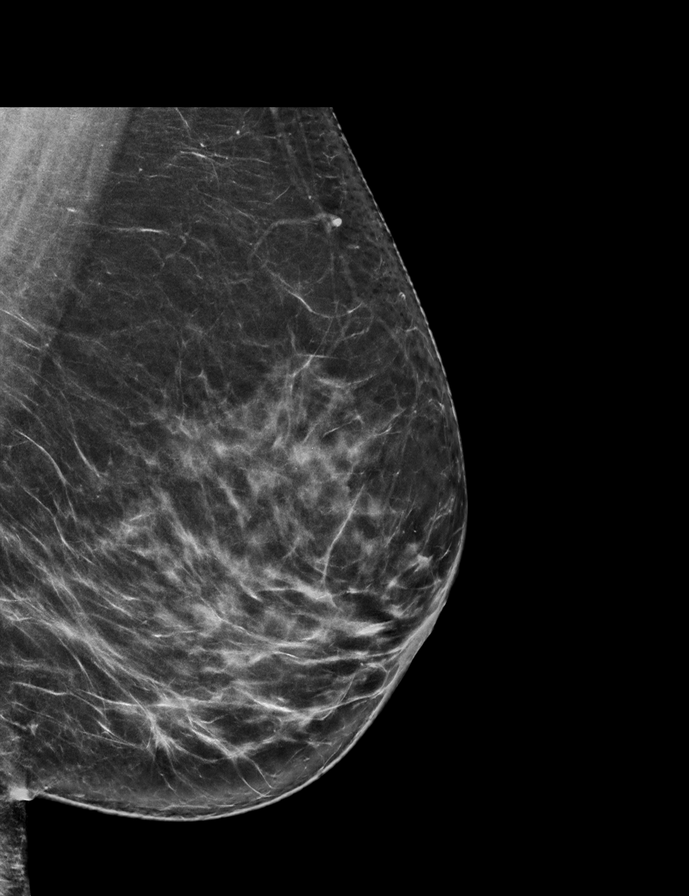

[L CC tomo · 2 of 74 frames shown]
[frame 24/74]
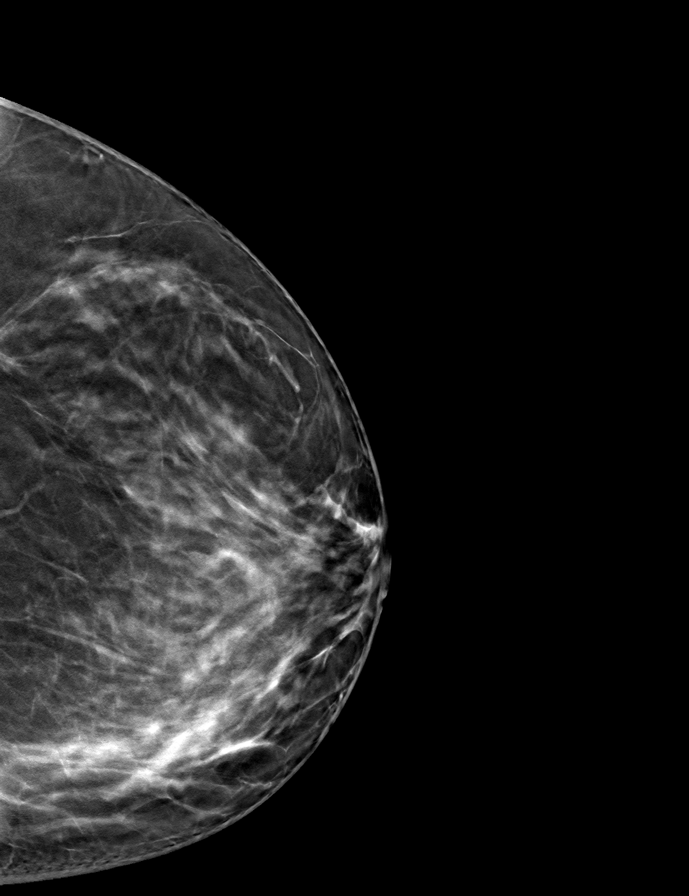
[frame 37/74]
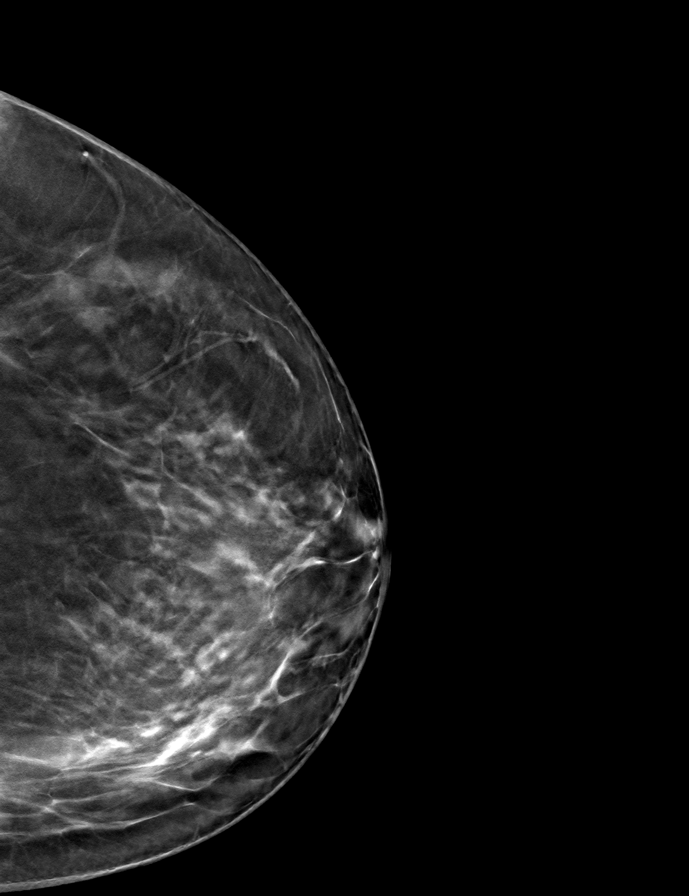

[L MLO tomo · tomo slice 38/75.0]
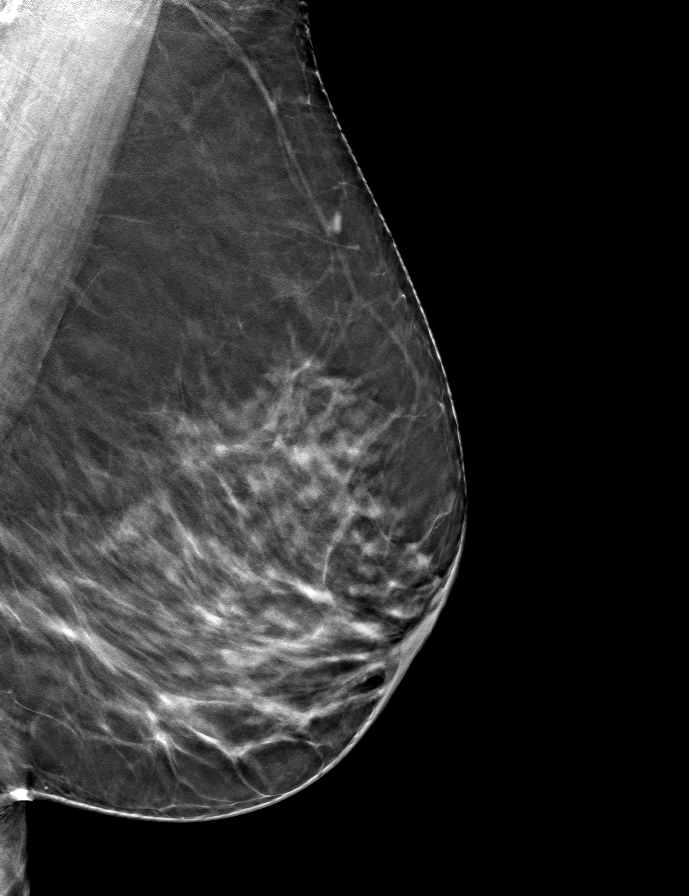

[R CC tomo · tomo slice 35/69.0]
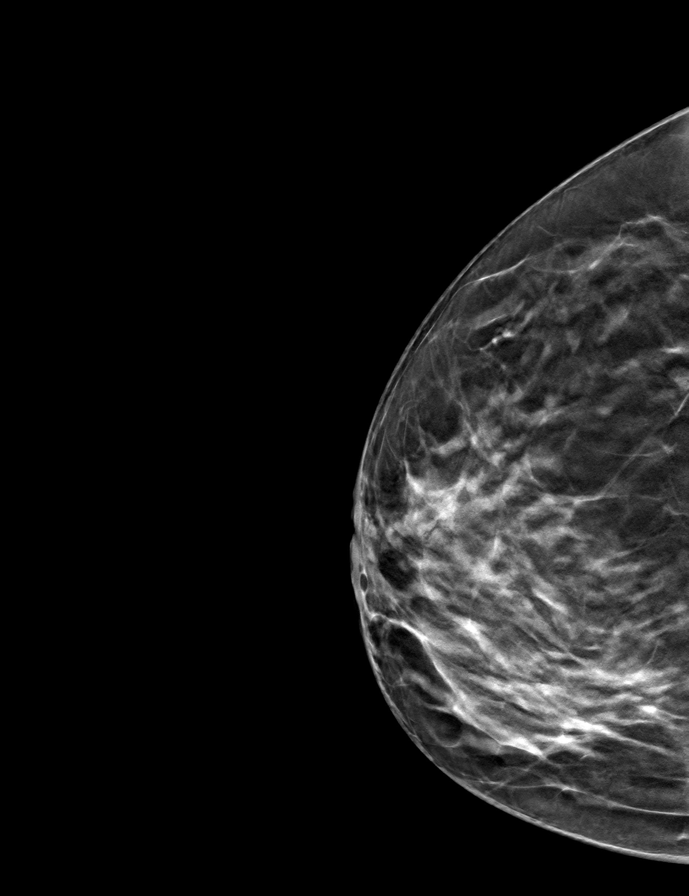

[R MLO tomo · tomo slice 37/72.0]
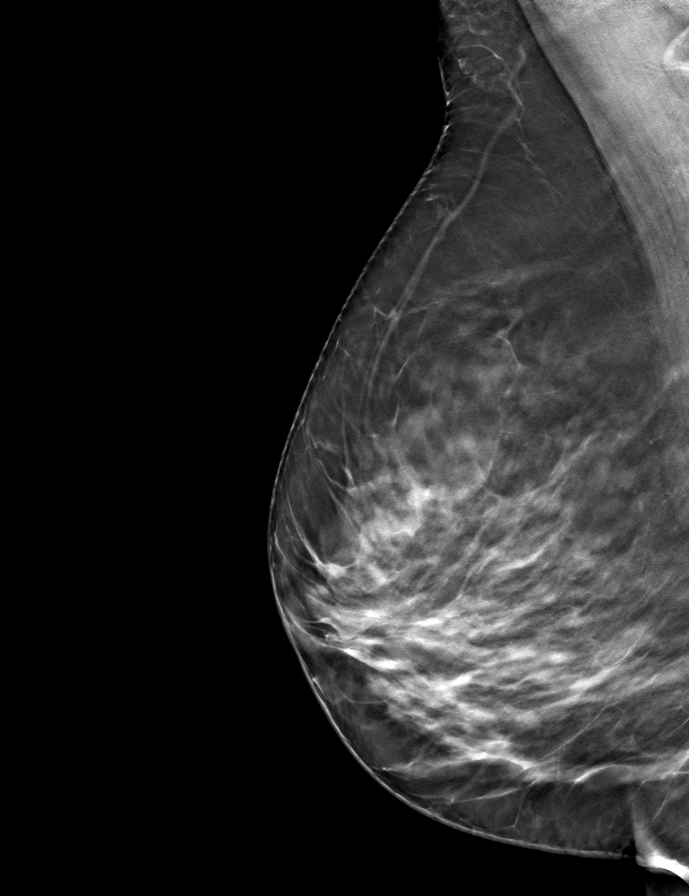

[9 of 24 positions shown; findings below may reference images not displayed]

ACR Breast Density Category c: The breast tissue is heterogeneously
dense, which may obscure small masses.
FINDINGS: There are no findings suspicious for malignancy.
IMPRESSION: No mammographic evidence of malignancy. A result letter of this
screening mammogram will be mailed directly to the patient.

RECOMMENDATION:
Screening mammogram in one year. (Code:Q3-W-BC3)

BI-RADS CATEGORY  1: Negative.

## 2023-03-25 ENCOUNTER — Other Ambulatory Visit: Payer: Self-pay | Admitting: *Deleted

## 2023-03-25 MED ORDER — VENLAFAXINE HCL ER 225 MG PO TB24: 1.0000 | ORAL_TABLET | Freq: Every day | ORAL | 2 refills | Status: DC

## 2023-03-25 MED ORDER — METHYLPHENIDATE HCL 10 MG PO TABS: ORAL_TABLET | ORAL | 0 refills | Status: DC

## 2023-03-25 MED ORDER — GABAPENTIN 600 MG PO TABS: ORAL_TABLET | ORAL | 2 refills | Status: AC

## 2023-03-25 NOTE — Telephone Encounter (Signed)
 Last seen 12/12/22 and next f/u 05/29/23. Last refilled methylphenidate 02/12/23 #90.

## 2023-05-06 ENCOUNTER — Encounter: Payer: Self-pay | Admitting: Neurology

## 2023-05-06 ENCOUNTER — Other Ambulatory Visit: Payer: Self-pay | Admitting: Neurology

## 2023-05-06 MED ORDER — DIAZEPAM 5 MG PO TABS: ORAL_TABLET | ORAL | 0 refills | Status: DC

## 2023-05-06 NOTE — Telephone Encounter (Addendum)
 [facility]) anytime we have a patient on sedative and stimulant: diazepam (VALIUM) 5 MG tablet and methylphenidate (RITALIN) 10 MG tablet we need documentation on why the patient is on these medications. Please contact: [phone]

## 2023-05-06 NOTE — Telephone Encounter (Signed)
 Lastseen 12/12/22, next appt scheduled 05/29/23 Dispenses   Dispensed Days Supply Quantity Provider Pharmacy  DIAZEPAM 5 MG TABLET 01/15/2023 90 180 each Lomax, Amy, NP [id number]...  DIAZEPAM 5 MG TABLET 09/05/2022 90 180 each Micki Riley, MD [facility] [phone] - T.[place] KitchenMarland Kitchen

## 2023-05-08 ENCOUNTER — Other Ambulatory Visit: Payer: Self-pay | Admitting: Family Medicine

## 2023-05-08 DIAGNOSIS — Z0000 Encounter for general adult medical examination without abnormal findings: Secondary | ICD-10-CM

## 2023-05-12 ENCOUNTER — Other Ambulatory Visit: Payer: Self-pay | Admitting: Neurology

## 2023-05-13 MED ORDER — METHYLPHENIDATE HCL 10 MG PO TABS: ORAL_TABLET | ORAL | 0 refills | Status: DC

## 2023-05-13 NOTE — Telephone Encounter (Signed)
 Last seen 12/12/2022. Next f/u 05/29/23. Last refilled 03/27/23 #90.

## 2023-05-15 ENCOUNTER — Encounter: Payer: Self-pay | Admitting: Neurology

## 2023-05-20 ENCOUNTER — Telehealth: Payer: Self-pay

## 2023-05-20 NOTE — Telephone Encounter (Signed)
 Faxed progress support/poc update w/Dr. Ledon Snare signature to [facility]

## 2023-05-29 ENCOUNTER — Ambulatory Visit: Payer: Medicare Other | Admitting: Neurology

## 2023-05-29 ENCOUNTER — Encounter: Payer: Self-pay | Admitting: Neurology

## 2023-05-29 VITALS — BP 119/70 | HR 77 | Ht 62.5 in | Wt 145.5 lb

## 2023-05-29 DIAGNOSIS — R5383 Other fatigue: Secondary | ICD-10-CM

## 2023-05-29 DIAGNOSIS — R269 Unspecified abnormalities of gait and mobility: Secondary | ICD-10-CM | POA: Diagnosis not present

## 2023-05-29 DIAGNOSIS — N319 Neuromuscular dysfunction of bladder, unspecified: Secondary | ICD-10-CM

## 2023-05-29 DIAGNOSIS — G8929 Other chronic pain: Secondary | ICD-10-CM

## 2023-05-29 DIAGNOSIS — M545 Low back pain, unspecified: Secondary | ICD-10-CM

## 2023-05-29 DIAGNOSIS — Z79899 Other long term (current) drug therapy: Secondary | ICD-10-CM | POA: Diagnosis not present

## 2023-05-29 DIAGNOSIS — G35 Multiple sclerosis: Secondary | ICD-10-CM

## 2023-05-29 DIAGNOSIS — F988 Other specified behavioral and emotional disorders with onset usually occurring in childhood and adolescence: Secondary | ICD-10-CM

## 2023-05-29 DIAGNOSIS — F32A Depression, unspecified: Secondary | ICD-10-CM

## 2023-05-29 MED ORDER — LAMOTRIGINE 100 MG PO TABS: 100.0000 mg | ORAL_TABLET | Freq: Two times a day (BID) | ORAL | 3 refills | Status: DC

## 2023-05-29 NOTE — Progress Notes (Signed)
 [facility]  PATIENT: Kellie [name] DOB: 09-07-1960  REFERRING CLINICIAN: Vernona Rieger HISTORY FROM: Patient  REASON FOR VISIT: Multiple Sclerosis   HISTORICAL  CHIEF COMPLAINT:  Chief Complaint  Patient presents with   Follow-up    Rm11, alone, MS low back pain GAIT fatigue History of urinary anomaly Dysesthesia, Insomnia due to medical condition f/u:pt stated that she is still doing PT for back pain, reported extreme fatigue but that she jazzercizes. Bilateral hand numbness and dexterity issues, reported helps the insomnia, jury excuse note written and left in the room for the provider to sign off on. Pt stated that she is unsure if she should still be taking the iron or not    HISTORY OF PRESENT ILLNESS:  Kellie [name] is a 63 y.o. woman with relapsing remitting MS diagnoses in 1997.   Update 05/29/2023: She feels her MS is mostly stable and she continues on Tysabri.    She has no exacerbations but notes some worsening symptoms.       Her last JCV Ab was 03/16/2023 (0.13 negative).   She has anemia and has seen hematology.   She was restarted on Reclast for her osteoporosis.  She tries to exercise and does some Jazzercise and 1/2 mile walks when she is at [employer].   Vit D is fine  She fell and hurt her shoulder.  She continues to have LBP.  She is doing PT.   She has weakness in both legs.   She has some dysesthesias.   Gabapentin and lamotrigine help the dysesthesias.       She has urinary hesitancy and self caths up to 10 times a day - she goes through.   She sees Dr. Sherron Monday at urology.  .   For spasms she takes Myrbetriq, oxybutynin.  At night she takes desmopressin which helps her reduce the frequency of catheterization so she can sleep better.   Due to frequent UTI, she is on nitrofurantoin.     LBP is worse.   At L3-L4, there is moderate spinal stenosis due to 2 mm anterolisthesis, disc bulging, severe facet hypertrophy with joint effusions and ligamenta  flava hypertrophy.  There is mild foraminal narrowing and moderate right lateral recess stenosis but there does not appear to be nerve root compression..   At L4-L5, there is mild spinal stenosis due to 6 mm anterolisthesis, severe facet hypertrophy and other degenerative change.  There is moderately severe right and moderate left lateral recess stenosis with some potential for right L5 nerve root compression.She has minimal anterior spondylolisthesis of L3 on L4.    Slight retrolisthesis of L2 on L3.  Multilevel degenerative changes in the lumbar spine, most pronounced at L4-5 and L5-S1.   She will be starting PT.    She also has left knee pain.  A MBB injection had not helped so she never had an ablation     She is trying to avoid surgery.    She wears a brace on that side.  Mood is better since getting a puppy (she had lost her dog of 13 years).  She is on Effexor and lamotrigine.   Fatigue and attention deficit are better with Ritalin.    She takes valium 10 mg nightly and is sleeping well now.   She has osteoporosis (last checked in 05/2019).   She stopped alendronate a couple years ago and is now on Forteo.   She fractured her hip in 2011.  MS HISTORY:   She presented with left sided numbness in September 1997.   She underwent an MRI and a lumbar puncture and was told MS was unlikely.   She had right sided numbness in January 1998 and had brain and spine MRI.    There were changes suggestive of MS and she was diagnosed in January 1998.   She was started on Betaseron.   She was on Betaseron x 10 years and then switched to Avonex for a year due to tolerability issues.   She had clinical exacerbations and MRI changes and then went on Tysabri in 2007 or 2008 and has had 92 infusions, her last infusion being 03/02/15.    She is transferring care from [facility] to [facility].  She tolerates Tysabri well.   She is JCV Ab negative.     An MRI of the spinal cord dated 02/02/2013 shows demyelinating  plaque to the left adjacent to C3, anteriorly adjacent to C4, to the right adjacent to C5-C6, posteriorly to the right adjacent to C6-C7. When that study was compared to an MRI dated 12/29/2006, there was no definite interval change. MRI of the brain the same day was abnormal showing signal changes compatible with the diagnosis of multiple sclerosis. Compared to the prior study dated 02/23/2012, there was no definite interval change.      MRI brain and cervical spine (09/2014) showing 4 cervical spine foci and typical demyelination plaques in brain (brain unchanged compared to 2012)..    She had MRI 05/2016 after a fall.  Repeat MRI of the brain and cervical spine 03/20/2017 did not show any new lesions compared to the 2016 MRIs.  STUDIES MRI brain 02/25/20 showed T2 hyperintense foci within the hemispheres and cerebellum in a pattern and configuration consistent with chronic demyelinating plaque associated with multiple sclerosis.  None of the foci appear to be acute.  Compared to the MRI dated 03/20/2017, there are no new lesions.  MRI cervical spine 02/25/2020 showed multiple T2 hyperintense foci within the spinal cord consistent with chronic demyelination associated with multiple sclerosis.  None of the foci appear to be acute and there were no new lesions compared to the 03/20/2017 MRI.    Multilevel degenerative changes    There is no spinal stenosis.  Foraminal narrowing is noted at several levels but there does not appear to be nerve root compression.  MRI cervical spine 03/21/2017 showed five T2 hyperintense foci within the spinal cord consistent with chronic demyelinating plaque associated with multiple sclerosis. None of these appear to be acute and they did not enhance. When compared to the MRI dated 08/04/2014, there is no interval change.    Multilevel degenerative changes as detailed above. The degenerative changes at C6-C7 have progressed when compared to the previous MRI.  There does not appear to  be any definite nerve root compression and there is no spinal stenosis or spinal cord compression.  MRI brain 03/20/2017 multiple T2/FLAIR hyperintense foci in the white matter of the hemispheres and cerebellum in a pattern and configuration consistent with chronic demyelinating plaque associated with multiple sclerosis. None of the foci appears to be acute. When compared to the MRI dated 08/17/2014, there is no interim change.     There is a normal enhancement pattern and there are no acute findings.   Bone density 06/02/2019 showed The BMD measured at AP Spine L1-L3 is 0.808 g/cm2 with a T-score of -3.0.   Patient is considered OSTEOPOROTIC according to [employer] (  WHO) criteria.  MRI brain showed Scattered T2/FLAIR hyperintense foci in the cerebral hemispheres and in the right middle cerebellar hemisphere in a pattern consistent with chronic demyelinating plaque associated with multiple sclerosis. None of the foci appear to be acute.   MRi lumbar showed At L3-L4, there is moderate spinal stenosis due to 2 mm anterolisthesis, disc bulging, severe facet hypertrophy with joint effusions and ligamenta flava hypertrophy.  There is mild foraminal narrowing and moderate right lateral recess stenosis but there does not appear to be nerve root compression. At L4-L5, there is mild spinal stenosis due to 6 mm anterolisthesis, severe facet hypertrophy and other degenerative change.  There is moderately severe right and moderate left lateral recess stenosis with some potential for right L5 nerve root compression.  REVIEW OF SYSTEMS:  Constitutional: No fevers, chills, sweats, or change in appetite.   Notes fatigue and some insomnia Eyes: No visual changes, double vision, eye pain Ear, nose and throat: No hearing loss, ear pain, nasal congestion, sore throat Cardiovascular: No chest pain, palpitations Respiratory:  No shortness of breath at rest or with exertion.   No wheezes GastrointestinaI: No  nausea, vomiting, diarrhea, abdominal pain.   She has dysphagia Genitourinary: as above. Musculoskeletal:  as above iiIntegumentary: No rash, pruritus, skin lesions Neurological: as above Psychiatric: Mild depression at this time.  Mild anxiety Endocrine: No palpitations, diaphoresis, change in appetite, change in weigh or increased thirst Hematologic/Lymphatic:  No anemia, purpura, petechiae. Allergic/Immunologic: No itchy/runny eyes, nasal congestion, recent allergic reactions, rashes  ALLERGIES: Allergies  Allergen Reactions   Erythromycin Nausea And Vomiting   Clindamycin/Lincomycin    Dilaudid [Hydromorphone Hcl] Other (See Comments)    Hallucinations   Sulfa Antibiotics     HOME MEDICATIONS:  Current Outpatient Medications:    ALPRAZolam (XANAX) 0.25 MG tablet, Take 1 tablet (0.25 mg total) by mouth at bedtime as needed for anxiety. TAKE 1 TABLET BY MOUTH AT BEDTIME AS NEEDED FOR ANXIETY, Disp: 30 tablet, Rfl: 0   Calcium Carbonate-Vitamin D 600-125 MG-UNIT TABS, Take by mouth., Disp: , Rfl:    cetirizine (ZYRTEC) 1 MG/ML syrup, Take 5 mg by mouth., Disp: , Rfl:    Cholecalciferol (VITAMIN D3 PO), Take 1,000 Units by mouth daily., Disp: , Rfl:    ciprofloxacin (CIPRO) 250 MG tablet, Take 1 tablet (250 mg total) by mouth daily as needed., Disp: 14 tablet, Rfl: 0   desmopressin (DDAVP) 0.1 MG tablet, TAKE 1 TABLET BY MOUTH EVERY DAY, Disp: 90 tablet, Rfl: 3   diazepam (VALIUM) 5 MG tablet, TAKE 1 TABLET BY MOUTH TWICE A DAY AS NEEDED, Disp: 180 tablet, Rfl: 0   famciclovir (FAMVIR) 250 MG tablet, TAKE 1 TABLET BY MOUTH TWICE A DAY, Disp: , Rfl:    Fish Oil-Cholecalciferol (FISH OIL + D3) 1000-1000 MG-UNIT CAPS, Take 3,600 mg by mouth daily. , Disp: , Rfl:    gabapentin (NEURONTIN) 600 MG tablet, TAKE 1 TO 2 TABLETS BY MOUTH DAILY, Disp: 180 tablet, Rfl: 2   lamoTRIgine (LAMICTAL) 100 MG tablet, TAKE 1 TABLET BY MOUTH TWICE A DAY, Disp: 180 tablet, Rfl: 2   losartan (COZAAR) 50  MG tablet, TAKE 1 TABLET BY MOUTH EVERY DAY, Disp: 90 tablet, Rfl: 0   methylphenidate (RITALIN) 10 MG tablet, Takes 2 tablets in the morning and 1 tablet midday, Disp: 90 tablet, Rfl: 0   MYRBETRIQ 50 MG TB24 tablet, TAKE 1 TABLET ONCE DAILY. (Patient taking differently: Take 50 mg by mouth 2 (two) times daily.), Disp:  30 tablet, Rfl: 6   natalizumab (TYSABRI) 300 MG/15ML injection, Inject into the vein., Disp: , Rfl:    natalizumab 300 mg in sodium chloride 0.9 % 100 mL, Inject into the vein., Disp: , Rfl:    nitrofurantoin (MACRODANTIN) 100 MG capsule, Take 1 capsule (100 mg total) by mouth at bedtime., Disp: 90 capsule, Rfl: 0   omeprazole (PRILOSEC) 20 MG capsule, 2 (two) times daily., Disp: , Rfl:    oxybutynin (DITROPAN-XL) 10 MG 24 hr tablet, TAKE 1 TABLET BY MOUTH DAILY. AS DIRECTED, Disp: 90 tablet, Rfl: 0   simvastatin (ZOCOR) 40 MG tablet, TAKE 1 TABLET AT BEDTIME FOR CHOLESTEROL, Disp: , Rfl:    Venlafaxine HCl 225 MG TB24, Take 1 tablet (225 mg total) by mouth daily., Disp: 90 tablet, Rfl: 2   vitamin B-12 (CYANOCOBALAMIN) 1000 MCG tablet, Take 2,500 mcg by mouth daily. , Disp: , Rfl:    PAST MEDICAL HISTORY: Past Medical History:  Diagnosis Date   Chronic kidney disease    Complication of anesthesia    pt states she got thrush after anesthesia   Multiple sclerosis (HCC)    Neuropathy    Osteoarthritis    Vision abnormalities     PAST SURGICAL HISTORY: Past Surgical History:  Procedure Laterality Date   CARPAL TUNNEL RELEASE Right    2010   CARPAL TUNNEL RELEASE Left    CESAREAN SECTION     COLONOSCOPY  07/26/14   ENDOMETRIAL ABLATION     2007   HIP FRACTURE SURGERY     hysterctomy     TUBAL LIGATION      FAMILY HISTORY: Family History  Problem Relation Age of Onset   Lung cancer Mother    Dementia Father    Diverticulitis Father    Pancreatitis Father    Breast cancer Cousin     SOCIAL HISTORY:  Social History   Socioeconomic History   Marital  status: Significant Other    Spouse name: Not on file   Number of children: Not on file   Years of education: Not on file   Highest education level: Not on file  Occupational History   Not on file  Tobacco Use   Smoking status: Former    Current packs/day: 0.00    Types: Cigarettes    Quit date: 03/29/1981    Years since quitting: 42.1   Smokeless tobacco: Never  Substance and Sexual Activity   Alcohol use: Yes    Alcohol/week: 0.0 standard drinks of alcohol    Comment: Occasional   Drug use: No   Sexual activity: Not on file  Other Topics Concern   Not on file  Social History Narrative   Not on file   Social Drivers of Health   Financial Resource Strain: Low Risk  (05/25/2023)   Received [facility]   Overall Financial Resource Strain (CARDIA)    Difficulty of Paying Living Expenses: Not very hard  Food Insecurity: No Food Insecurity (05/25/2023)   Received from [facility]   Hunger Vital Sign    Worried About Running Out of Food in the Last Year: Never true    Ran Out of Food in the Last Year: Never true  Transportation Needs: No Transportation Needs (05/25/2023)   Received from [facility] - Transportation    Lack of Transportation (Medical): No    Lack of Transportation (Non-Medical): No  Physical Activity: Sufficiently Active (05/25/2023)   Received from [facility]   Exercise Vital  Sign    Days of Exercise per Week: 5 days    Minutes of Exercise per Session: 60 min  Stress: Stress Concern Present (05/25/2023)   Received from [facility] - Occupational Stress Questionnaire    Feeling of Stress : To some extent  Social Connections: Moderately Integrated (05/25/2023)   Received from [facility]   Social Network    How would you rate your social network (family, work, friends)?: Adequate participation with social networks  Intimate Partner Violence: Not At Risk (05/25/2023)   Received from [facility]   HITS    Over the last 12 months how often did your partner physically hurt you?: Never    Over the last 12 months how often did your partner insult you or talk down to you?: Never    Over the last 12 months how often did your partner threaten you with physical harm?: Never    Over the last 12 months how often did your partner scream or curse at you?: Never     PHYSICAL EXAM from 02/12/2018  Vitals:   05/29/23 1123  BP: 119/70  Pulse: 77  Weight: 145 lb 8 oz (66 kg)  Height: 5' 2.5" (1.588 m)    Body mass index is 26.19 kg/m.   General: The patient is well-developed and well-nourished and in no acute distress   Neurologic Exam  Mental status: The patient is alert and oriented x 3 at the time of the examination. The patient has apparent normal recent and remote memory, with an apparently normal attention span and concentration ability.   Speech is normal.  Cranial nerves: Extraocular movements are full.  Facial strength and sensation is normal.  Hearing seemed normal.   Motor:  Muscle bulk is normal.    Muscle tone is increased in the legs, right greater than left.  Strength is 5/5 except 4+/5 iliopsoas muscles  Sensory: Se has intact touch and vibration in arms and reduced vibraiton in legs, left much worse than right .   Coordination: Cerebellar testing shows good finger-nose-finger but mildly reduced heel-to-shin.  Gait and station: Station is normal.   She leans forward while walking.  She has a mildly wide gait and left spasticity the tandem gait is wide.  Romberg is negative.  Reflexes: Deep tendon reflexes are increased bilaterally in legs.  DTRs are increased at the knees with spread.  There is no ankle clonus.       Multiple sclerosis (HCC)  High risk medication use  Chronic midline low back pain without sciatica  Abnormality of gait  Other fatigue  Attention deficit disorder (ADD) without hyperactivity  Depression, unspecified depression  type  Neurogenic bladder   1.    Continue Tysabri.   No exacerbations.  She is JCV antibody negative.   MRI's last year showed no new lesions. 2.   She will continue to stay active and exercise as tolerated.    3.    Continue medications including Ritalin for ADD/fatigue.  4.    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  If pain worse, refer to neurosurgery as she has spinal stenosis and 6 mm anterolisthesis at L4L5 and other significant DJD.  However, she is very reluctant to do surgery She will return to see [place] in 6 months or sooner if there are new or worsening neurologic symptoms.  40-minute office visit with the majority of the time spent face-to-face for history and physical, MRI personal  review, discussion/counseling and decision-making.  Additional time with record review and documentation.  This visit is part of a comprehensive longitudinal care medical relationship regarding the patients primary diagnosis of MS and related concerns.   Kellie [name] A. Epimenio Foot, MD, PhD 05/29/2023, 11:37 AM Certified in Neurology, Clinical Neurophysiology, Sleep Medicine, Pain Medicine and Neuroimaging  [facility] [address] [place], [place] [address] [phone]

## 2023-06-10 ENCOUNTER — Ambulatory Visit
Admission: RE | Admit: 2023-06-10 | Discharge: 2023-06-10 | Disposition: A | Discharge status: Home / Self Care | Source: Ambulatory Visit | Attending: Family Medicine | Admitting: Family Medicine

## 2023-06-10 DIAGNOSIS — Z0000 Encounter for general adult medical examination without abnormal findings: Secondary | ICD-10-CM

## 2023-06-12 ENCOUNTER — Ambulatory Visit

## 2023-06-17 ENCOUNTER — Encounter: Payer: Self-pay | Admitting: Neurology

## 2023-06-26 ENCOUNTER — Other Ambulatory Visit: Payer: Self-pay | Admitting: Neurology

## 2023-06-30 ENCOUNTER — Encounter: Payer: Self-pay | Admitting: Neurology

## 2023-06-30 MED ORDER — METHYLPHENIDATE HCL 10 MG PO TABS: ORAL_TABLET | ORAL | 0 refills | Status: DC

## 2023-06-30 NOTE — Telephone Encounter (Signed)
 Last seen on 05/29/23 Follow up scheduled on 01/05/24   Dispensed Days Supply Quantity Provider Pharmacy  METHYLPHENID 10MG    TAB 05/13/2023 30 90 each Sater, Sherida Dimmer, MD [facility] [phone] ...      Rx pending to be signed

## 2023-07-01 ENCOUNTER — Other Ambulatory Visit: Payer: Self-pay | Admitting: Neurology

## 2023-07-01 NOTE — Telephone Encounter (Signed)
 Last seen on 05/29/23 Follow up scheduled on 01/05/24   Dispensed Days Supply Quantity Provider Pharmacy  DIAZEPAM  5MG         TAB 05/09/2023 90 180 each Sater, Sherida Dimmer, MD [facility] [phone] ...     Rx pending to be signed

## 2023-07-02 NOTE — Telephone Encounter (Signed)
 Error

## 2023-07-30 ENCOUNTER — Encounter: Payer: Self-pay | Admitting: Neurology

## 2023-07-30 NOTE — Telephone Encounter (Signed)
 Per [place], pt r/s to 08/15/23 for infusion

## 2023-08-06 ENCOUNTER — Telehealth: Payer: Self-pay

## 2023-08-06 ENCOUNTER — Encounter: Payer: Self-pay | Admitting: Neurology

## 2023-08-06 NOTE — Telephone Encounter (Signed)
 Pt returned call. Pt would like for nurse to call back when she becomes available. Pt stated that she is needing a copy of a CD she brought to the office back in May 2023.

## 2023-08-06 NOTE — Telephone Encounter (Signed)
 Called and spoke with pt. Dr. Mordecai Applebaum unable to locate MRI CD she is looking for. She will request another copy from place she had imaging completed at.

## 2023-08-06 NOTE — Telephone Encounter (Signed)
 LVM for pt to call back about her CD'S.

## 2023-08-08 ENCOUNTER — Encounter: Payer: Self-pay | Admitting: Neurology

## 2023-08-11 ENCOUNTER — Encounter: Payer: Self-pay | Admitting: Neurology

## 2023-08-12 ENCOUNTER — Inpatient Hospital Stay
Admission: RE | Admit: 2023-08-12 | Discharge: 2023-08-12 | Disposition: A | Discharge status: Home / Self Care | Payer: Self-pay | Source: Ambulatory Visit | Attending: Pulmonary Disease | Admitting: Pulmonary Disease

## 2023-08-12 ENCOUNTER — Ambulatory Visit: Admitting: Pulmonary Disease

## 2023-08-12 ENCOUNTER — Encounter: Payer: Self-pay | Admitting: Pulmonary Disease

## 2023-08-12 ENCOUNTER — Ambulatory Visit
Admission: RE | Admit: 2023-08-12 | Discharge: 2023-08-12 | Disposition: A | Discharge status: Home / Self Care | Source: Ambulatory Visit | Attending: Pulmonary Disease | Admitting: Pulmonary Disease

## 2023-08-12 VITALS — BP 122/60 | HR 80 | Temp 97.1°F | Ht 62.5 in | Wt 140.0 lb

## 2023-08-12 DIAGNOSIS — J470 Bronchiectasis with acute lower respiratory infection: Secondary | ICD-10-CM | POA: Diagnosis not present

## 2023-08-12 DIAGNOSIS — Z9289 Personal history of other medical treatment: Secondary | ICD-10-CM

## 2023-08-12 DIAGNOSIS — G35 Multiple sclerosis: Secondary | ICD-10-CM | POA: Diagnosis not present

## 2023-08-12 DIAGNOSIS — J189 Pneumonia, unspecified organism: Secondary | ICD-10-CM | POA: Diagnosis not present

## 2023-08-12 MED ORDER — METHYLPHENIDATE HCL 10 MG PO TABS: ORAL_TABLET | ORAL | 0 refills | Status: DC

## 2023-08-12 NOTE — Patient Instructions (Signed)
 VISIT SUMMARY:  Kellie [name], a 63 year old female with multiple sclerosis, visited due to an abnormal lung scan and wheezing. She experienced a persistent cough that worsened when lying down and was treated with doxycycline and cough syrup. Despite initial concerns about tuberculosis, tests were negative. She was prescribed albuterol for wheezing and has shown significant improvement. 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  YOUR PLAN:  -COMMUNITY-ACQUIRED PNEUMONIA: You have acute pneumonia in your left lung, likely acquired from the community and possibly caused by mycoplasma pneumonia. This condition involves an infection in the lungs, leading to symptoms like coughing, congestion, and wheezing. Your treatment with doxycycline has led to significant improvement. There is no evidence of tuberculosis or mycobacterium avium complex, so you do not need to be quarantined. We will order a chest x-ray today to ensure the pneumonia is resolving and schedule a follow-up chest CT in 6-8 weeks to confirm it has cleared. You can also resume your multiple sclerosis treatment.  -BRONCHIECTASIS: Bronchiectasis is a condition where the airways in your lungs are damaged, leading to persistent cough and mucus production. This may be related to a past mycobacterium avium complex infection. Your current symptoms include a slight persistent cough, but your clinical improvement suggests there is no active infection. We will monitor your symptoms and consider repeating the CT scan in 4-6 weeks.  -MULTIPLE SCLEROSIS: Multiple sclerosis is a condition that affects the central nervous system, leading to symptoms like muscle weakness and bladder issues. Your MS treatment was postponed due to initial concerns about tuberculosis, but you can now resume it as there is no evidence of active infection.  INSTRUCTIONS:  Please get a chest x-ray today  to ensure your pneumonia is resolving. We will also schedule a follow-up chest CT in 4-6  weeks to confirm the pneumonia has cleared. You can resume your multiple sclerosis treatment as there is no evidence of active infection.

## 2023-08-12 NOTE — Telephone Encounter (Signed)
 Last seen on 05/29/22 Follow up scheduled on 01/05/24   Dispensed Days Supply Quantity Provider Pharmacy  METHYLPHENID 10MG    TAB 06/30/2023 30 90 each Sater, Sherida Dimmer, MD [facility] [phone] ...      Rx pending to be signed

## 2023-08-12 NOTE — Progress Notes (Signed)
 Subjective:    Patient ID: Kellie [name], female    DOB: [birth year], 63 y.o.   MRN: [id number]  Patient Care Team: Lem Pyle, MD as PCP - General (Family Medicine)  Chief Complaint  Patient presents with   Consult    CT scan on 07/29/2023. No SOB or cough. Wheezing all the time.     BACKGROUND: Very complex 63 year old remote former smoker, with multiple sclerosis and other issues as noted below, presents for evaluation of pneumonia, patient notes improvement in symptoms and has residual wheezing.  She is kindly referred by Armida Lander, MD   HPI Discussed the use of AI scribe software for clinical note transcription with the patient, who gave verbal consent to proceed.  History of Present Illness   Rhyan Wolters is a 63 year old female with multiple sclerosis who presents with an abnormal lung scan and wheezing.  She began experiencing a persistent cough on the night of Jul 25, 2023, which worsened when lying down. She visited urgent care on Jul 27, 2023, and subsequently saw a physician assistant on Jul 29, 2023, due to worsening symptoms. She was prescribed doxycycline for ten days and cough syrup to aid sleep. Despite the treatment, she continued to experience a hacking cough without significant sputum production.  A CT scan was performed on Jul 29, 2023. She carries a diagnosis of bronchiectasis. 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 She was quarantined due to initial concerns about TB, which affected her multiple sclerosis treatment schedule.  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  She was prescribed albuterol for wheezing and reports significant improvement in symptoms over the past three to four days. She has a history of a negative Quantiferon Gold test for TB from two years ago, despite initial weakly positive results that were later deemed false  positives.  Her past medical history includes multiple sclerosis, for which she performs self-catheterization due to bladder issues and takes Macrobid  daily to prevent infections. She also has Cipro  on hand for bladder infections. She has a history of bronchiectasis, which may be related to prior MAC exposure. She owns a parrot, which was considered a potential source of infection when she was initially evaluated in 2023.  She states that she does not come in contact with the parrot now.  She had COVID in 2022, evaluation by Dr. Lala Pili of [facility] in [facility] in 2023 noted that the patient had bronchiectasis.  Review of Systems A 10 point review of systems was performed and it is as noted above otherwise negative.   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  Insomnia 08/20/2018   Knee pain, right 02/12/2018   High risk medication use 08/07/2017   UTI (urinary tract infection) 07/29/2016   Multiple sclerosis (HCC) 06/07/2015   Trochanteric bursitis of right hip 12/07/2014   Hypertriglyceridemia 08/03/2014   Numbness 08/03/2014   Constipation 08/03/2014   Other fatigue 08/03/2014   DS (disseminated sclerosis) (HCC) 03/29/2014   Depression 03/29/2014   History of urinary anomaly 03/29/2014   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    Smoked 0.25 PPD at her heaviest.  Substance Use Topics   Alcohol use: Yes    Alcohol/week: 0.0 standard drinks of alcohol    Comment: Occasional    Allergies  Allergen Reactions   Erythromycin Nausea And Vomiting   Clindamycin/Lincomycin    Dilaudid [Hydromorphone Hcl] Other (See Comments)    Hallucinations   Sulfa Antibiotics     Current Meds  Medication Sig   albuterol (VENTOLIN HFA) 108 (90 Base) MCG/ACT inhaler Inhale 2 puffs into the lungs every 6 (six) hours as needed.   ALPRAZolam  (XANAX ) 0.25 MG tablet Take 1 tablet (0.25 mg total) by mouth at bedtime as needed for anxiety. TAKE 1 TABLET BY MOUTH AT BEDTIME AS NEEDED FOR ANXIETY   Calcium Carbonate-Vitamin D 600-125 MG-UNIT TABS Take by mouth.   cetirizine (ZYRTEC) 1 MG/ML syrup Take 5 mg by mouth.   Cholecalciferol (VITAMIN D3 PO) Take 1,000 Units by mouth daily.   ciprofloxacin  (CIPRO ) 250 MG tablet Take 1 tablet (250 mg total) by mouth daily as needed.   desmopressin  (DDAVP ) 0.1 MG tablet TAKE 1 TABLET BY MOUTH EVERY DAY   diazepam  (VALIUM ) 5 MG tablet Take 1 tablet by mouth twice daily as needed   famciclovir (FAMVIR) 250 MG tablet TAKE 1 TABLET BY MOUTH TWICE A DAY   Fish Oil-Cholecalciferol (FISH OIL + D3) 1000-1000 MG-UNIT CAPS Take 3,600 mg by mouth daily.    gabapentin  (NEURONTIN ) 600 MG tablet TAKE 1 TO 2 TABLETS BY MOUTH DAILY   lamoTRIgine  (LAMICTAL ) 100 MG tablet Take 1 tablet (100 mg total) by mouth 2 (two) times daily.   losartan  (COZAAR ) 50 MG tablet  TAKE 1 TABLET BY MOUTH EVERY DAY   methylphenidate  (RITALIN ) 10 MG tablet Takes 2 tablets in the morning and 1 tablet midday   MYRBETRIQ 50 MG TB24 tablet TAKE 1 TABLET ONCE DAILY. (Patient taking differently: Take 50 mg by mouth 2 (two) times daily.)   natalizumab  (TYSABRI ) 300 MG/15ML injection Inject into the vein.   natalizumab  300 mg in sodium chloride  0.9 % 100 mL Inject into the vein.   nitrofurantoin  (MACRODANTIN ) 100 MG capsule Take 1 capsule (100 mg total) by mouth at bedtime.   omeprazole (PRILOSEC) 20 MG capsule 2 (two) times daily.   oxybutynin  (DITROPAN -XL) 10 MG 24 hr tablet TAKE 1 TABLET BY MOUTH DAILY. AS DIRECTED   simvastatin (ZOCOR) 40 MG tablet TAKE 1 TABLET AT BEDTIME FOR  CHOLESTEROL   Venlafaxine  HCl 225 MG TB24 Take 1 tablet (225 mg total) by mouth daily.   vitamin B-12 (CYANOCOBALAMIN) 1000 MCG tablet Take 2,500 mcg by mouth daily.     Immunization History  Administered Date(s) Administered   Influenza, Seasonal, Injecte, Preservative Fre 01/03/2023   PFIZER(Purple Top)SARS-COV-2 Vaccination 05/30/2019, 06/20/2019, 11/02/2019   PNEUMOCOCCAL CONJUGATE-20 11/07/2022   Pfizer(Comirnaty)Fall Seasonal Vaccine 12 years and older 12/02/2021   Respiratory Syncytial Virus Vaccine,Recomb Aduvanted(Arexvy) 01/06/2023        Objective:     BP 122/60 (BP Location: Right Arm, Cuff Size: Normal)   Pulse 80   Temp (!) 97.1 F (36.2 C)   Ht 5' 2.5 (1.588 m)   Wt 140 lb (63.5 kg) Comment: per patient. In a wheelchair today  SpO2 99%   BMI 25.20 kg/m   SpO2: 99 % O2 Device: None (Room air)  GENERAL: Well-developed, well-nourished woman who presents in wheelchair.  No conversational dyspnea. HEAD: Normocephalic, atraumatic.  EYES: Pupils equal, round, reactive to light.  No scleral icterus.  MOUTH: Patient wearing mask. NECK: Supple. No thyromegaly. Trachea midline. No JVD.  No adenopathy. PULMONARY: Good air entry bilaterally.  Few rhonchi on left, no  wheezes. CARDIOVASCULAR: S1 and S2. Regular rate and rhythm.  No rubs, murmurs or gallops heard. ABDOMEN: Benign. MUSCULOSKELETAL: No joint deformity, no clubbing, no edema.  NEUROLOGIC: Weakness in both legs, gait not tested as patient presents in wheelchair today. SKIN: Intact,warm,dry. PSYCH: Somewhat anxious mood, behavior normal.  Representative images (sequential) from CT performed 29 Jul 2023 through [facility] showing lingular infiltrate       Assessment & Plan:     ICD-10-CM   1. Community acquired pneumonia of left lung, unspecified part of lung  J18.9     2. Bronchiectasis with acute lower respiratory infection (HCC)  J47.0 CT CHEST WO CONTRAST    DG Chest 2 View    3. Multiple sclerosis (HCC)  G35     4. History of CT scan of chest  Z92.89 CT OUTSIDE FILMS CHEST    5. History of chest x-ray  Z92.89 DG Outside Films Chest      Orders Placed This Encounter  Procedures   CT OUTSIDE FILMS CHEST    Standing Status:   Future    Number of Occurrences:   1    Expiration Date:   08/11/2024    Where was this CD imported?:   [facility]   DG Outside Films Chest    Standing Status:   Future    Number of Occurrences:   1    Expiration Date:   08/11/2024    Where was this CD imported?:   [facility]   CT CHEST WO CONTRAST    Standing Status:   Future    Expected Date:   09/23/2023    Expiration Date:   08/11/2024    Scheduling Instructions:     Pts choice of imaging center on [place].    Preferred imaging location?:   [facility]   Discussion:    Community-acquired pneumonia Acute pneumonia in the left lung, likely community-acquired, possibly mycoplasma pneumonia. Symptoms included coughing, congestion, and wheezing. Doxycycline treatment has led to significant clinical improvement. No evidence of tuberculosis or mycobacterium avium complex on CT scan. No need for quarantine. Significant improvement noted over the past few days. - Order chest  x-ray today to ensure no worsening of pneumonia - Schedule follow-up chest CT in 6-8 weeks to confirm clearance -  Allow resumption of multiple sclerosis treatment  Bronchiectasis Bronchiectasis noted on CT, possibly related to prior mycobacterium avium complex infection. Current symptoms include a slight persistent cough. Clinical improvement suggests no active infection. - Monitor symptoms and consider repeating CT in 6-8 weeks - Repeat CT scan will also allow reassessment of bronchiectasis  Multiple sclerosis Multiple sclerosis with bladder involvement requiring self-catheterization. MS treatment was postponed due to initial concern for tuberculosis, now cleared. She can resume MS treatment as there is no evidence of active infection contraindicating it. - Resume multiple sclerosis treatment   NOTE: Patient is on Macrodantin  for urinary tract infection prophylaxis.  Warned patient about potential pulmonary issues with Macrodantin  particularly pulmonary fibrosis.  Advised to watch for symptoms.  Were discussed.     Advised if symptoms do not improve or worsen, to please contact office for sooner follow up or seek emergency care.    I spent 60 minutes of dedicated to the care of this patient on the date of this encounter to include pre-visit review of records, face-to-face time with the patient discussing conditions above, post visit ordering of testing, clinical documentation with the electronic health record, making appropriate referrals as documented, and communicating necessary findings to members of the patients care team.   C. Chloe Counter, MD Advanced Bronchoscopy PCCM [facility]    *This note was dictated using voice recognition software/Dragon.  Despite best efforts to proofread, errors can occur which can change the meaning. Any transcriptional errors that result from this process are unintentional and may not be fully corrected at the time of dictation.

## 2023-08-18 ENCOUNTER — Ambulatory Visit: Payer: Self-pay | Admitting: Pulmonary Disease

## 2023-08-18 ENCOUNTER — Ambulatory Visit: Admitting: Pulmonary Disease

## 2023-08-18 ENCOUNTER — Encounter: Payer: Self-pay | Admitting: Pulmonary Disease

## 2023-08-18 ENCOUNTER — Telehealth: Payer: Self-pay

## 2023-08-18 NOTE — Telephone Encounter (Signed)
 Lm on patient secure voicemail notifying her. I have also, sent her a MyChart message.  Nothing further needed.

## 2023-08-18 NOTE — Telephone Encounter (Signed)
 Copied from CRM [id number]. Topic: Clinical - Lab/Test Results >> Aug 18, 2023 11:14 AM Kellie [name] wrote: Reason for CRM:   Pt received her chest x-ray results today and reports normal results. She would like to know if she still needs to follow up with the CT scan scheduled for 06/24 or if she can cancel it.   CB#  [phone]

## 2023-08-18 NOTE — Telephone Encounter (Signed)
 I wanted the chest x-ray to make sure that her pneumonia was not getting worse.  It is not.  However, there were some changes on the initial CT that were too faint for a regular chest x-ray to pick up.  I just want to make sure that these cleared completely.

## 2023-08-18 NOTE — Telephone Encounter (Signed)
 She still needs the CT because there were areas that did not show on the initial chest x-ray that did not show on the CT.

## 2023-08-19 NOTE — Telephone Encounter (Signed)
 Copied from CRM [id number]. Topic: Clinical - Lab/Test Results >> Aug 18, 2023  4:20 PM Kellie [name] wrote: Reason for CRM: patient is worried that her results from 05/18 is being read instead of 06/03. Please call patient to explain which results are being read and that she still need a ct scan because she seem to not understand

## 2023-08-20 ENCOUNTER — Encounter: Payer: Self-pay | Admitting: Pulmonary Disease

## 2023-09-02 ENCOUNTER — Encounter: Payer: Self-pay | Admitting: Neurology

## 2023-09-02 ENCOUNTER — Ambulatory Visit (HOSPITAL_BASED_OUTPATIENT_CLINIC_OR_DEPARTMENT_OTHER)
Admission: RE | Admit: 2023-09-02 | Discharge: 2023-09-02 | Disposition: A | Discharge status: Home / Self Care | Source: Ambulatory Visit | Attending: Pulmonary Disease | Admitting: Pulmonary Disease

## 2023-09-02 DIAGNOSIS — J470 Bronchiectasis with acute lower respiratory infection: Secondary | ICD-10-CM | POA: Insufficient documentation

## 2023-09-05 ENCOUNTER — Telehealth: Payer: Self-pay

## 2023-09-05 NOTE — Telephone Encounter (Signed)
 Copied from CRM [id number]. Topic: Clinical - Lab/Test Results >> Sep 05, 2023 11:05 AM Joesph PARAS wrote: Reason for CRM: Patient is calling to state that she has not gotten her CT results yet. Patient needs these results so that she can get them to her neurologist so he can continue her care.

## 2023-09-07 ENCOUNTER — Encounter: Payer: Self-pay | Admitting: Neurology

## 2023-09-08 ENCOUNTER — Encounter: Payer: Self-pay | Admitting: Neurology

## 2023-09-08 NOTE — Telephone Encounter (Signed)
 Checking with [facility] to see when they can work pt in.  MD wanted 1 day IV solumedrol

## 2023-09-15 ENCOUNTER — Other Ambulatory Visit: Payer: Self-pay

## 2023-09-15 ENCOUNTER — Encounter: Payer: Self-pay | Admitting: Neurology

## 2023-09-15 ENCOUNTER — Telehealth: Payer: Self-pay | Admitting: *Deleted

## 2023-09-15 DIAGNOSIS — G35 Multiple sclerosis: Secondary | ICD-10-CM

## 2023-09-15 NOTE — Telephone Encounter (Signed)
 Placed JCV lab in [facility] lock box for routine lab pick up. Results pending.

## 2023-09-16 LAB — CBC WITH DIFFERENTIAL/PLATELET
Basophils Absolute: 0.1 x10E3/uL (ref 0.0–0.2)
Basos: 1 %
EOS (ABSOLUTE): 0.3 x10E3/uL (ref 0.0–0.4)
Eos: 4 %
Hematocrit: 33.3 % — ABNORMAL LOW (ref 34.0–46.6)
Hemoglobin: 11.2 g/dL (ref 11.1–15.9)
Immature Grans (Abs): 0.2 x10E3/uL — ABNORMAL HIGH (ref 0.0–0.1)
Immature Granulocytes: 2 %
Lymphocytes Absolute: 2.9 x10E3/uL (ref 0.7–3.1)
Lymphs: 38 %
MCH: 32.5 pg (ref 26.6–33.0)
MCHC: 33.6 g/dL (ref 31.5–35.7)
MCV: 97 fL (ref 79–97)
Monocytes Absolute: 0.7 x10E3/uL (ref 0.1–0.9)
Monocytes: 9 %
NRBC: 1 % — ABNORMAL HIGH (ref 0–0)
Neutrophils Absolute: 3.5 x10E3/uL (ref 1.4–7.0)
Neutrophils: 46 %
Platelets: 207 x10E3/uL (ref 150–450)
RBC: 3.45 x10E6/uL — ABNORMAL LOW (ref 3.77–5.28)
RDW: 14.4 % (ref 11.7–15.4)
WBC: 7.6 x10E3/uL (ref 3.4–10.8)

## 2023-09-25 ENCOUNTER — Other Ambulatory Visit: Payer: Self-pay | Admitting: *Deleted

## 2023-09-25 MED ORDER — METHYLPHENIDATE HCL 10 MG PO TABS: ORAL_TABLET | ORAL | 0 refills | Status: DC

## 2023-09-25 NOTE — Telephone Encounter (Signed)
 Pt sent mychart asking for refill on methylphenidate . Last seen 05/29/23 and next f/u 01/05/24. Last refilled 08/12/23 #90.

## 2023-09-30 ENCOUNTER — Ambulatory Visit: Admitting: Pulmonary Disease

## 2023-10-01 ENCOUNTER — Other Ambulatory Visit (HOSPITAL_COMMUNITY): Payer: Self-pay

## 2023-10-01 ENCOUNTER — Telehealth: Payer: Self-pay

## 2023-10-01 NOTE — Telephone Encounter (Signed)
 Pharmacy Patient Advocate Encounter   Received notification from CoverMyMeds that prior authorization for Venlafaxine  HCl ER 225MG  er tablets is required/requested.   Insurance verification completed.   The patient is insured through [facility] .   Per test claim: PA required; PA submitted to above mentioned insurance via CoverMyMeds Key/confirmation #/EOC (Key: [id number])   Status is pending

## 2023-10-02 ENCOUNTER — Other Ambulatory Visit (HOSPITAL_COMMUNITY): Payer: Self-pay

## 2023-10-02 NOTE — Telephone Encounter (Signed)
 Pharmacy Patient Advocate Encounter  Received notification from [facility] that Prior Authorization for Venlafaxine  HCl ER 225MG  er tablets has been APPROVED from 7.23.25 to 7.23.26. Ran test claim, Copay is $0.00. This test claim was processed through [facility]- copay amounts may vary at other pharmacies due to pharmacy/plan contracts, or as the patient moves through the different stages of their insurance plan.   PA #/Case ID/Reference #: (Key: [id number])

## 2023-10-06 ENCOUNTER — Telehealth: Payer: Self-pay | Admitting: Neurology

## 2023-10-06 NOTE — Telephone Encounter (Signed)
 Maurilio asked me to call and offer 10/13/23 @ 9:30am. Pt is not able to come that day she scheduled for her infusion that day.   Will discuss with Maurilio on 10/07/23 other options.

## 2023-10-06 NOTE — Telephone Encounter (Signed)
 Pt called to request to speak to MD . Pt states that she is scaried because Pt is continually  falling . Pt states she has had a fall almost everyday of last week . Pt states she doesn't feel any dizziness  at all . All Pt know is that her legs ger week and she falls . Pt is concern and not sure what to do .

## 2023-10-07 NOTE — Telephone Encounter (Signed)
 Spoke with Kellie [name] and pt can be seen on 10/13/23 @ 9:30 am prior to infusion at 10:30 am. Patient verberlized she understood her appointment and time.

## 2023-10-13 ENCOUNTER — Encounter: Payer: Self-pay | Admitting: Neurology

## 2023-10-13 ENCOUNTER — Ambulatory Visit: Admitting: Neurology

## 2023-10-13 VITALS — BP 127/68 | HR 76 | Ht 62.5 in | Wt 148.5 lb

## 2023-10-13 DIAGNOSIS — N319 Neuromuscular dysfunction of bladder, unspecified: Secondary | ICD-10-CM | POA: Diagnosis not present

## 2023-10-13 DIAGNOSIS — Z79899 Other long term (current) drug therapy: Secondary | ICD-10-CM

## 2023-10-13 DIAGNOSIS — R269 Unspecified abnormalities of gait and mobility: Secondary | ICD-10-CM | POA: Diagnosis not present

## 2023-10-13 DIAGNOSIS — G35 Multiple sclerosis: Secondary | ICD-10-CM

## 2023-10-13 DIAGNOSIS — F988 Other specified behavioral and emotional disorders with onset usually occurring in childhood and adolescence: Secondary | ICD-10-CM | POA: Diagnosis not present

## 2023-10-13 DIAGNOSIS — F32A Depression, unspecified: Secondary | ICD-10-CM

## 2023-10-13 DIAGNOSIS — R5383 Other fatigue: Secondary | ICD-10-CM | POA: Diagnosis not present

## 2023-10-13 NOTE — Progress Notes (Signed)
 [facility]  PATIENT: Kellie [name] DOB: 10-01-1960  REFERRING CLINICIAN: Comer Gaskins HISTORY FROM: Patient  REASON FOR VISIT: Multiple Sclerosis   HISTORICAL  CHIEF COMPLAINT:  Chief Complaint  Patient presents with   Follow-up    Pt in room 11. Husband in room.Here for several falls. Pt reports several falls in the last months. Infusion scheduled today at 10:30am. Husband states pt looses balance a lot.     HISTORY OF PRESENT ILLNESS:  Kellie [name] is a 63 y.o. woman with relapsing remitting MS diagnoses in 1997.   Update 10/13/2023: She feels her MS is mostly stable and she continues on Tysabri  (next infusion later today).   Due to pneumonia.  She went to UC and PCP and was placed on Abx.   She feels her respiratory symptoms improved. .    She has no exacerbations but notes some worsening symptoms.       Her last JCV Ab was 03/16/2023 (0.19 negative).     She has anemia and has seen hematology.  She is taking iron now.   She was restarted on Reclast for her osteoporosis.  She does not think it is scheduled.     She tries to exercise and does some Jazzercise and 1/2 mile walks when she is at [employer].   Vit D is fine  Gait has been worse since the pneumonia.  She has weakness in both legs.   She stopped Jazzercise due to balance.   We discussed PT.   She has some dysesthesias.   Gabapentin  and lamotrigine  help the dysesthesias.       She has urinary hesitancy and self caths 10 times a day - she goes through her month early.   She sees Dr. MacDiarmid at urology.   For spasms she takes Myrbetriq, oxybutynin .  At night she takes desmopressin  which helps her reduce the frequency of catheterization so she can sleep better.  She does not need to catherize at night.   Due to frequent UTI, she is on nitrofurantoin .     She feels mood is a little worse with her pneumonia -- worrying more.  She is on Effexor  and lamotrigine .   Fatigue and attention deficit are better with  Ritalin .    She takes valium  10 mg nightly and is sleeping well now.   She has LBP.   At L3-L4, there is moderate spinal stenosis due to 2 mm anterolisthesis, disc bulging, severe facet hypertrophy with joint effusions and ligamenta flava hypertrophy.  There is mild foraminal narrowing and moderate right lateral recess stenosis but there does not appear to be nerve root compression..   At L4-L5, there is mild spinal stenosis due to 6 mm anterolisthesis, severe facet hypertrophy and other degenerative change.  There is moderately severe right and moderate left lateral recess stenosis with some potential for right L5 nerve root compression.She has minimal anterior spondylolisthesis of L3 on L4.    Slight retrolisthesis of L2 on L3.  Multilevel degenerative changes in the lumbar spine, most pronounced at L4-5 and L5-S1.   She will be starting PT.    She also has left knee pain.  A MBB injection had not helped so she never had an ablation     She is trying to avoid surgery.    She wears a brace on that side.  She has osteoporosis (last checked in 05/2019).   She stopped alendronate  a couple years ago and is now on Forteo.   She  fractured her hip in 2011.         MS HISTORY:   She presented with left sided numbness in September 1997.   She underwent an MRI and a lumbar puncture and was told MS was unlikely.   She had right sided numbness in January 1998 and had brain and spine MRI.    There were changes suggestive of MS and she was diagnosed in January 1998.   She was started on Betaseron.   She was on Betaseron x 10 years and then switched to Avonex for a year due to tolerability issues.   She had clinical exacerbations and MRI changes and then went on Tysabri  in 2007 or 2008 and has had 92 infusions, her last infusion being 03/02/15.    She is transferring care from [facility] to [facility].  She tolerates Tysabri  well.   She is JCV Ab negative.     An MRI of the spinal cord dated 02/02/2013 shows  demyelinating plaque to the left adjacent to C3, anteriorly adjacent to C4, to the right adjacent to C5-C6, posteriorly to the right adjacent to C6-C7. When that study was compared to an MRI dated 12/29/2006, there was no definite interval change. MRI of the brain the same day was abnormal showing signal changes compatible with the diagnosis of multiple sclerosis. Compared to the prior study dated 02/23/2012, there was no definite interval change.      MRI brain and cervical spine (09/2014) showing 4 cervical spine foci and typical demyelination plaques in brain (brain unchanged compared to 2012)..    She had MRI 05/2016 after a fall.  Repeat MRI of the brain and cervical spine 03/20/2017 did not show any new lesions compared to the 2016 MRIs.  STUDIES MRI brain 02/25/20 showed T2 hyperintense foci within the hemispheres and cerebellum in a pattern and configuration consistent with chronic demyelinating plaque associated with multiple sclerosis.  None of the foci appear to be acute.  Compared to the MRI dated 03/20/2017, there are no new lesions.  MRI cervical spine 02/25/2020 showed multiple T2 hyperintense foci within the spinal cord consistent with chronic demyelination associated with multiple sclerosis.  None of the foci appear to be acute and there were no new lesions compared to the 03/20/2017 MRI.    Multilevel degenerative changes    There is no spinal stenosis.  Foraminal narrowing is noted at several levels but there does not appear to be nerve root compression.  MRI cervical spine 03/21/2017 showed five T2 hyperintense foci within the spinal cord consistent with chronic demyelinating plaque associated with multiple sclerosis. None of these appear to be acute and they did not enhance. When compared to the MRI dated 08/04/2014, there is no interval change.    Multilevel degenerative changes as detailed above. The degenerative changes at C6-C7 have progressed when compared to the previous MRI.  There does  not appear to be any definite nerve root compression and there is no spinal stenosis or spinal cord compression.  MRI brain 03/20/2017 multiple T2/FLAIR hyperintense foci in the white matter of the hemispheres and cerebellum in a pattern and configuration consistent with chronic demyelinating plaque associated with multiple sclerosis. None of the foci appears to be acute. When compared to the MRI dated 08/17/2014, there is no interim change.     There is a normal enhancement pattern and there are no acute findings.   Bone density 06/02/2019 showed The BMD measured at AP Spine L1-L3 is 0.808 g/cm2 with a T-score  of -3.0.   Patient is considered OSTEOPOROTIC according to World Health Organization [facility]) criteria.  MRI brain showed Scattered T2/FLAIR hyperintense foci in the cerebral hemispheres and in the right middle cerebellar hemisphere in a pattern consistent with chronic demyelinating plaque associated with multiple sclerosis. None of the foci appear to be acute.   MRi lumbar showed At L3-L4, there is moderate spinal stenosis due to 2 mm anterolisthesis, disc bulging, severe facet hypertrophy with joint effusions and ligamenta flava hypertrophy.  There is mild foraminal narrowing and moderate right lateral recess stenosis but there does not appear to be nerve root compression. At L4-L5, there is mild spinal stenosis due to 6 mm anterolisthesis, severe facet hypertrophy and other degenerative change.  There is moderately severe right and moderate left lateral recess stenosis with some potential for right L5 nerve root compression.  REVIEW OF SYSTEMS:  Constitutional: No fevers, chills, sweats, or change in appetite.   Notes fatigue and some insomnia Eyes: No visual changes, double vision, eye pain Ear, nose and throat: No hearing loss, ear pain, nasal congestion, sore throat Cardiovascular: No chest pain, palpitations Respiratory:  No shortness of breath at rest or with exertion.   No  wheezes GastrointestinaI: No nausea, vomiting, diarrhea, abdominal pain.   She has dysphagia Genitourinary: as above. Musculoskeletal:  as above iiIntegumentary: No rash, pruritus, skin lesions Neurological: as above Psychiatric: Mild depression at this time.  Mild anxiety Endocrine: No palpitations, diaphoresis, change in appetite, change in weigh or increased thirst Hematologic/Lymphatic:  No anemia, purpura, petechiae. Allergic/Immunologic: No itchy/runny eyes, nasal congestion, recent allergic reactions, rashes  ALLERGIES: Allergies  Allergen Reactions   Erythromycin Nausea And Vomiting   Clindamycin/Lincomycin    Dilaudid [Hydromorphone Hcl] Other (See Comments)    Hallucinations   Sulfa Antibiotics     HOME MEDICATIONS:  Current Outpatient Medications:    albuterol (VENTOLIN HFA) 108 (90 Base) MCG/ACT inhaler, Inhale 2 puffs into the lungs every 6 (six) hours as needed., Disp: , Rfl:    ALPRAZolam  (XANAX ) 0.25 MG tablet, Take 1 tablet (0.25 mg total) by mouth at bedtime as needed for anxiety. TAKE 1 TABLET BY MOUTH AT BEDTIME AS NEEDED FOR ANXIETY, Disp: 30 tablet, Rfl: 0   Calcium Carbonate-Vitamin D 600-125 MG-UNIT TABS, Take by mouth., Disp: , Rfl:    cetirizine (ZYRTEC) 1 MG/ML syrup, Take 5 mg by mouth., Disp: , Rfl:    Cholecalciferol (VITAMIN D3 PO), Take 1,000 Units by mouth daily., Disp: , Rfl:    ciprofloxacin  (CIPRO ) 250 MG tablet, Take 1 tablet (250 mg total) by mouth daily as needed., Disp: 14 tablet, Rfl: 0   desmopressin  (DDAVP ) 0.1 MG tablet, TAKE 1 TABLET BY MOUTH EVERY DAY, Disp: 90 tablet, Rfl: 3   diazepam  (VALIUM ) 5 MG tablet, Take 1 tablet by mouth twice daily as needed, Disp: 180 tablet, Rfl: 0   famciclovir (FAMVIR) 250 MG tablet, TAKE 1 TABLET BY MOUTH TWICE A DAY, Disp: , Rfl:    Fish Oil-Cholecalciferol (FISH OIL + D3) 1000-1000 MG-UNIT CAPS, Take 3,600 mg by mouth daily. , Disp: , Rfl:    gabapentin  (NEURONTIN ) 600 MG tablet, TAKE 1 TO 2 TABLETS BY  MOUTH DAILY, Disp: 180 tablet, Rfl: 2   lamoTRIgine  (LAMICTAL ) 100 MG tablet, Take 1 tablet (100 mg total) by mouth 2 (two) times daily., Disp: 180 tablet, Rfl: 3   losartan  (COZAAR ) 50 MG tablet, TAKE 1 TABLET BY MOUTH EVERY DAY, Disp: 90 tablet, Rfl: 0   methylphenidate  (RITALIN ) 10  MG tablet, Takes 2 tablets in the morning and 1 tablet midday, Disp: 90 tablet, Rfl: 0   MYRBETRIQ 50 MG TB24 tablet, TAKE 1 TABLET ONCE DAILY. (Patient taking differently: Take 50 mg by mouth 2 (two) times daily.), Disp: 30 tablet, Rfl: 6   natalizumab  (TYSABRI ) 300 MG/15ML injection, Inject into the vein., Disp: , Rfl:    natalizumab  300 mg in sodium chloride  0.9 % 100 mL, Inject into the vein., Disp: , Rfl:    nitrofurantoin  (MACRODANTIN ) 100 MG capsule, Take 1 capsule (100 mg total) by mouth at bedtime., Disp: 90 capsule, Rfl: 0   omeprazole (PRILOSEC) 20 MG capsule, 2 (two) times daily., Disp: , Rfl:    oxybutynin  (DITROPAN -XL) 10 MG 24 hr tablet, TAKE 1 TABLET BY MOUTH DAILY. AS DIRECTED, Disp: 90 tablet, Rfl: 0   simvastatin (ZOCOR) 40 MG tablet, TAKE 1 TABLET AT BEDTIME FOR CHOLESTEROL, Disp: , Rfl:    Venlafaxine  HCl 225 MG TB24, Take 1 tablet (225 mg total) by mouth daily., Disp: 90 tablet, Rfl: 2   vitamin B-12 (CYANOCOBALAMIN) 1000 MCG tablet, Take 2,500 mcg by mouth daily. , Disp: , Rfl:    PAST MEDICAL HISTORY: Past Medical History:  Diagnosis Date   Chronic kidney disease    Complication of anesthesia    pt states she got thrush after anesthesia   Multiple sclerosis (HCC)    Neuropathy    Osteoarthritis    Vision abnormalities     PAST SURGICAL HISTORY: Past Surgical History:  Procedure Laterality Date   CARPAL TUNNEL RELEASE Right    2010   CARPAL TUNNEL RELEASE Left    CESAREAN SECTION     COLONOSCOPY  07/26/14   ENDOMETRIAL ABLATION     2007   HIP FRACTURE SURGERY     hysterctomy     TUBAL LIGATION      FAMILY HISTORY: Family History  Problem Relation Age of Onset   Lung  cancer Mother    Dementia Father    Diverticulitis Father    Pancreatitis Father    Breast cancer Cousin     SOCIAL HISTORY:  Social History   Socioeconomic History   Marital status: Significant Other    Spouse name: Not on file   Number of children: Not on file   Years of education: Not on file   Highest education level: Not on file  Occupational History   Not on file  Tobacco Use   Smoking status: Former    Current packs/day: 0.00    Types: Cigarettes    Quit date: 03/29/1981    Years since quitting: 42.5   Smokeless tobacco: Never   Tobacco comments:    Started smoking at 14 years.    Smoked 0.25 PPD at her heaviest.  Vaping Use   Vaping status: Never Used  Substance and Sexual Activity   Alcohol use: Yes    Alcohol/week: 0.0 standard drinks of alcohol    Comment: Occasional   Drug use: No   Sexual activity: Not on file  Other Topics Concern   Not on file  Social History Narrative   Not on file   Social Drivers of Health   Financial Resource Strain: Low Risk  (05/25/2023)   Received from [facility]   Overall Financial Resource Strain (CARDIA)    Difficulty of Paying Living Expenses: Not very hard  Food Insecurity: No Food Insecurity (05/25/2023)   Received from [facility]   Hunger Vital Sign    Within the past  12 months, you worried that your food would run out before you got the money to buy more.: Never true    Within the past 12 months, the food you bought just didn't last and you didn't have money to get more.: Never true  Transportation Needs: No Transportation Needs (05/25/2023)   Received from [facility] - Transportation    Lack of Transportation (Medical): No    Lack of Transportation (Non-Medical): No  Physical Activity: Sufficiently Active (05/25/2023)   Received from [facility]   Exercise Vital Sign    On average, how many days per week do you engage in moderate to strenuous exercise (like a brisk walk)?: 5 days    On  average, how many minutes do you engage in exercise at this level?: 60 min  Stress: Stress Concern Present (05/25/2023)   Received from [facility] - Occupational Stress Questionnaire    Feeling of Stress : To some extent  Social Connections: Moderately Integrated (05/25/2023)   Received from [facility]   Social Network    How would you rate your social network (family, work, friends)?: Adequate participation with social networks  Intimate Partner Violence: Not At Risk (05/25/2023)   Received from [facility]   HITS    Over the last 12 months how often did your partner physically hurt you?: Never    Over the last 12 months how often did your partner insult you or talk down to you?: Never    Over the last 12 months how often did your partner threaten you with physical harm?: Never    Over the last 12 months how often did your partner scream or curse at you?: Never     PHYSICAL EXAM from 02/12/2018  Vitals:   10/13/23 0918  BP: 127/68  Pulse: 76  Weight: 148 lb 8 oz (67.4 kg)  Height: 5' 2.5 (1.588 m)    Body mass index is 26.73 kg/m.   General: The patient is well-developed and well-nourished and in no acute distress   Neurologic Exam  Mental status: The patient is alert and oriented x 3 at the time of the examination. The patient has apparent normal recent and remote memory, with an apparently normal attention span and concentration ability.   Speech is normal.  Cranial nerves: Extraocular movements are full.  Facial strength and sensation is normal.  Hearing seemed normal.   Motor:  Muscle bulk is normal.    Muscle tone is increased in the legs, right greater than left.  Strength is 5/5 except 4+/5 iliopsoas muscles  Sensory: Se has intact touch and vibration in arms and reduced vibraiton in legs, left much worse than right .   Coordination: Cerebellar testing shows good finger-nose-finger but mildly reduced  heel-to-shin.  Gait and station: Station is normal.   She leans forward while walking.  The gait is mildly wide.  The turn is unstable.  She cannot do a good tandem walk.  Romberg is negative.  Reflexes: Deep tendon reflexes are increased bilaterally in legs.  DTRs are increased at the knees with spread.  There is no ankle clonus.       Abnormality of gait  Attention deficit disorder (ADD) without hyperactivity  Neurogenic bladder  Other fatigue  Depression, unspecified depression type  Multiple sclerosis (HCC)  High risk medication use   1.    I believe that she has had a pseudo-exacerbation due to the pneumonia.  Continue Tysabri .   No exacerbations.  She is JCV antibody negative.   MRI's 05/2022 showed no new lesions.  She has improved and hopefully will be back at baseline in the next month to help with her recovery I have referred to physical therapy closer to her home 2.   She will continue to stay active and exercise as tolerated.    3.    Continue medications including Ritalin  for ADD/fatigue.  4.    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  If pain worse, refer to neurosurgery as she has spinal stenosis and 6 mm anterolisthesis at L4L5 and other significant DJD.  However, she is very reluctant to do surgery She will return to see us  in 6 months or sooner if there are new or worsening neurologic symptoms.  43-minute office visit with the majority of the time spent face-to-face for history and physical, MRI personal review, discussion/counseling and decision-making.  Additional time with record review and documentation.  This visit is part of a comprehensive longitudinal care medical relationship regarding the patients primary diagnosis of MS and related concerns.   Vassie Kugel A. Vear, MD, PhD 10/13/2023, 10:02 AM Certified in Neurology, Clinical Neurophysiology, Sleep Medicine, Pain Medicine and Neuroimaging  [facility] [address] [place], [place] [address] [phone]

## 2023-10-14 ENCOUNTER — Telehealth: Payer: Self-pay | Admitting: Neurology

## 2023-10-14 NOTE — Telephone Encounter (Addendum)
 Referral for physical therapy fax has been put in Dr. Duncan mailbox for signature. [facility] requesting provider signature on referral.

## 2023-10-16 NOTE — Telephone Encounter (Signed)
 Referral for physical therapy fax to [facility]. Phone: [phone], Fax: [phone]

## 2023-11-08 ENCOUNTER — Encounter: Payer: Self-pay | Admitting: Neurology

## 2023-11-11 ENCOUNTER — Other Ambulatory Visit: Payer: Self-pay | Admitting: *Deleted

## 2023-11-11 MED ORDER — METHYLPHENIDATE HCL 10 MG PO TABS: ORAL_TABLET | ORAL | 0 refills | Status: DC

## 2023-11-11 MED ORDER — DIAZEPAM 5 MG PO TABS: 5.0000 mg | ORAL_TABLET | Freq: Two times a day (BID) | ORAL | 0 refills | Status: DC | PRN

## 2023-11-11 NOTE — Telephone Encounter (Signed)
 Dr. Vear- pt sent mychart asking for refill on methylphenidate  and diazepam . Last seen 10/13/23 and next f/u 04/14/24  Last refilled methylphenidate  09/25/23 #90  For the diazepam , current instructions read: Take 1 tablet by mouth twice daily as needed  However, last note states: She takes valium  10 mg nightly and is sleeping well now. Did you want to update directions to match this?  Last refilled 08/06/23 #180

## 2023-11-11 NOTE — Telephone Encounter (Signed)
 Dr. Vear- see note below. Your notes and prescription directions for diazepam  do not match

## 2023-11-18 ENCOUNTER — Encounter: Payer: Self-pay | Admitting: Neurology

## 2023-12-10 ENCOUNTER — Other Ambulatory Visit: Payer: Self-pay | Admitting: *Deleted

## 2023-12-10 MED ORDER — METHYLPHENIDATE HCL 10 MG PO TABS: ORAL_TABLET | ORAL | 0 refills | Status: DC

## 2023-12-10 NOTE — Telephone Encounter (Signed)
 Pt sent my chart message requesting refill  Dr.Athar you are work in provider  Last seen on 10/13/23 Follow up scheduled on 04/14/24  Dispensed Days Supply Quantity Provider Pharmacy  METHYLPHENID 10MG    TAB 11/11/2023 30 90 each Sater, Charlie LABOR, MD [facility] [phone] ...   Rx pending to be signed

## 2023-12-16 ENCOUNTER — Telehealth: Payer: Self-pay

## 2023-12-16 NOTE — Telephone Encounter (Signed)
*  [facility]  Pharmacy Patient Advocate Encounter   Received notification from CoverMyMeds that prior authorization for Methylphenidate  HCl 10MG  tablets  is required/requested.   Insurance verification completed.   The patient is insured through [facility].   Per test claim: PA required; PA submitted to above mentioned insurance via Latent Key/confirmation #/EOC [id number] Status is pending

## 2023-12-17 NOTE — Telephone Encounter (Signed)
 Pharmacy Patient Advocate Encounter  Received notification from [facility] that Prior Authorization for Methylphenidate   has been APPROVED from 12/16/2023 to 12/15/2024

## 2023-12-23 MED ORDER — METHYLPHENIDATE HCL 10 MG PO TABS: ORAL_TABLET | ORAL | 0 refills | Status: DC

## 2023-12-23 NOTE — Addendum Note (Signed)
 Addended by: JOSHUA HERRING L on: 12/23/2023 12:12 PM   Modules accepted: Orders

## 2023-12-23 NOTE — Telephone Encounter (Signed)
 Called pharmacy below and spoke w/ tech. Transferred to Dan/pharmacist.  Cx rx methylphenidate  sent 12/10/23 to their pharmacy.  [facility] [address] [place] - [address] [place] [place] [address] Phone: [phone]  Fax: [phone]      Resent to MD to e-scribe to [facility] instead per pt request.

## 2024-01-05 ENCOUNTER — Ambulatory Visit: Admitting: Neurology

## 2024-01-16 ENCOUNTER — Encounter: Payer: Self-pay | Admitting: Neurology

## 2024-01-19 MED ORDER — METHYLPHENIDATE HCL 10 MG PO TABS: ORAL_TABLET | ORAL | 0 refills | Status: DC

## 2024-01-19 NOTE — Telephone Encounter (Signed)
 Requested Prescriptions   Pending Prescriptions Disp Refills   methylphenidate  (RITALIN ) 10 MG tablet 90 tablet 0    Sig: Takes 2 tablets in the morning and 1 tablet midday   Last seen 10/13/23 Next appt 04/14/24 Dispenses   Dispensed Days Supply Quantity Provider Pharmacy  METHYLPHENIDATE  HCL 10 MG TABS 12/23/2023 30 90 tablet Vear Charlie LABOR, MD [facility] [id number] ...  METHYLPHENID 10MG    TAB 11/11/2023 30 90 each Sater, Charlie LABOR, MD [facility] [phone] ...  METHYLPHENID 10MG    TAB 09/25/2023 30 90 each Vear Charlie LABOR, MD [facility] [phone] ...  METHYLPHENID 10MG    TAB 08/12/2023 30 90 each Sater, Charlie LABOR, MD [facility] [phone] ...  METHYLPHENID 10MG    TAB 06/30/2023 30 90 each Sater, Charlie LABOR, MD [facility] [phone] ...  METHYLPHENID 10MG    TAB 05/13/2023 30 90 each Sater, Charlie LABOR, MD [facility] [phone] ...  METHYLPHENIDATE  HCL 10 MG TABS 03/27/2023 30 90 tablet Sater, Charlie LABOR, MD [facility] [phone] ...  METHYLPHENIDATE  10 MG TABLET 02/12/2023 30 90 each Sater, Charlie LABOR, MD [facility] [phone] - T.SABRASABRA

## 2024-01-21 ENCOUNTER — Other Ambulatory Visit: Payer: Self-pay | Admitting: Neurology

## 2024-01-21 MED ORDER — VENLAFAXINE HCL ER 225 MG PO TB24: 1.0000 | ORAL_TABLET | Freq: Every day | ORAL | 0 refills | Status: DC

## 2024-01-21 NOTE — Addendum Note (Signed)
 Addended by: NEYSA NENA RAMAN on: 01/21/2024 04:10 PM   Modules accepted: Orders

## 2024-01-22 NOTE — Telephone Encounter (Signed)
 Last seen on 10/13/23 Follow up scheduled on 04/14/24

## 2024-02-07 ENCOUNTER — Other Ambulatory Visit: Payer: Self-pay | Admitting: Neurology

## 2024-02-09 NOTE — Telephone Encounter (Signed)
 Medication: Valium   Directions:  Take 1 tablet (5 mg total) by mouth 2 (two) times daily as needed.  Last given: 11/11/2023 Number refills: 0 Last o/v:  Follow up:  Labs:      Please review refill request.

## 2024-02-12 LAB — HM DEXA SCAN

## 2024-02-29 ENCOUNTER — Encounter: Payer: Self-pay | Admitting: Neurology

## 2024-03-01 MED ORDER — METHYLPHENIDATE HCL 10 MG PO TABS: ORAL_TABLET | ORAL | 0 refills | Status: DC

## 2024-03-01 NOTE — Telephone Encounter (Signed)
 Requested Prescriptions   Pending Prescriptions Disp Refills   methylphenidate  (RITALIN ) 10 MG tablet 90 tablet 0    Sig: Takes 2 tablets in the morning and 1 tablet midday   Last seen 10/13/23 Next appt 04/14/24 out this Score  Dispenses   Dispensed Days Supply Quantity Provider Pharmacy  METHYLPHENIDATE  10 MG TABLET 01/19/2024 30 90 each Sater, Charlie LABOR, MD [facility] [phone] - T...  METHYLPHENID 10MG    TAB 11/11/2023 30 90 each Sater, Charlie LABOR, MD [facility] [phone] ...  METHYLPHENID 10MG    TAB 09/25/2023 30 90 each Vear Charlie LABOR, MD [facility] [phone] ...  METHYLPHENID 10MG    TAB 08/12/2023 30 90 each Sater, Charlie LABOR, MD [facility] [phone] ...  METHYLPHENID 10MG    TAB 06/30/2023 30 90 each Sater, Charlie LABOR, MD [facility] [phone] ...  METHYLPHENID 10MG    TAB 05/13/2023 30 90 each Sater, Charlie LABOR, MD [facility] [phone] ...  METHYLPHENIDATE  HCL 10 MG TABS 03/27/2023 30 90 tablet Sater, Charlie LABOR, MD [facility] [phone] .SABRASABRA

## 2024-03-07 ENCOUNTER — Encounter: Payer: Self-pay | Admitting: Neurology

## 2024-03-07 DIAGNOSIS — G35C1 Active secondary progressive multiple sclerosis: Secondary | ICD-10-CM | POA: Insufficient documentation

## 2024-03-12 ENCOUNTER — Encounter: Payer: Self-pay | Admitting: Neurology

## 2024-03-19 ENCOUNTER — Telehealth: Payer: Self-pay | Admitting: Neurology

## 2024-03-19 NOTE — Telephone Encounter (Signed)
 Notified intrfusion that pa needs to be done

## 2024-03-19 NOTE — Telephone Encounter (Signed)
 Patient initiated PA for natalizumab  (TYSABRI ) 300 MG/15ML injection. Will fax over form with some question to be completed

## 2024-03-24 NOTE — Telephone Encounter (Signed)
 Pt is asking to be called with the status of the PA as she needs the Tysabri  , please call. Phone rep offered to connect pt to infusion dept. Pt was told this is for Insurance and RN for Dr Vear to resolve.

## 2024-03-25 ENCOUNTER — Encounter: Payer: Self-pay | Admitting: Neurology

## 2024-03-26 ENCOUNTER — Encounter: Payer: Self-pay | Admitting: Neurology

## 2024-03-30 NOTE — Telephone Encounter (Signed)
 Pt is request forms to be signed correctly and sent re faxed to [employer] will need Dr Vear to complete the Formulary Exception form and fax it to [employer] at [phone].  Pt stated he dates were wrong

## 2024-03-31 NOTE — Telephone Encounter (Signed)
 I spoke to [facility] directly and they stated that they contacted the pt and told her they manage the pa and that our office isn't responsible for that. They did mention that we get providers to sign forms for them but that's it. Pt understood according to kim from [facility] that communication regarding the pa goes thru them.

## 2024-04-14 ENCOUNTER — Ambulatory Visit: Admitting: Neurology

## 2024-04-14 ENCOUNTER — Telehealth: Payer: Self-pay | Admitting: Neurology

## 2024-04-14 ENCOUNTER — Encounter: Payer: Self-pay | Admitting: Neurology

## 2024-04-14 VITALS — BP 112/60 | HR 75 | Resp 14 | Ht 62.2 in | Wt 145.5 lb

## 2024-04-14 DIAGNOSIS — F32A Depression, unspecified: Secondary | ICD-10-CM

## 2024-04-14 DIAGNOSIS — G35C1 Active secondary progressive multiple sclerosis: Secondary | ICD-10-CM

## 2024-04-14 DIAGNOSIS — F988 Other specified behavioral and emotional disorders with onset usually occurring in childhood and adolescence: Secondary | ICD-10-CM

## 2024-04-14 DIAGNOSIS — M4317 Spondylolisthesis, lumbosacral region: Secondary | ICD-10-CM | POA: Diagnosis not present

## 2024-04-14 DIAGNOSIS — Z79899 Other long term (current) drug therapy: Secondary | ICD-10-CM | POA: Diagnosis not present

## 2024-04-14 DIAGNOSIS — G8929 Other chronic pain: Secondary | ICD-10-CM

## 2024-04-14 DIAGNOSIS — N319 Neuromuscular dysfunction of bladder, unspecified: Secondary | ICD-10-CM

## 2024-04-14 DIAGNOSIS — M545 Low back pain, unspecified: Secondary | ICD-10-CM

## 2024-04-14 DIAGNOSIS — R269 Unspecified abnormalities of gait and mobility: Secondary | ICD-10-CM

## 2024-04-14 MED ORDER — METHYLPHENIDATE HCL 10 MG PO TABS: ORAL_TABLET | ORAL | 0 refills | Status: AC

## 2024-04-14 MED ORDER — DALFAMPRIDINE ER 10 MG PO TB12: ORAL_TABLET | ORAL | 11 refills | Status: AC

## 2024-04-14 MED ORDER — DULOXETINE HCL 60 MG PO CPEP: 60.0000 mg | ORAL_CAPSULE | Freq: Every day | ORAL | 3 refills | Status: AC

## 2024-04-14 NOTE — Telephone Encounter (Signed)
MRI order sent to [place] [phone]

## 2024-04-14 NOTE — Progress Notes (Signed)
 "  [facility]  PATIENT: Erlinda Solinger DOB: Jan 04, 1961  REFERRING CLINICIAN: Comer Gaskins HISTORY FROM: Patient   REASON FOR VISIT: Multiple Sclerosis   HISTORICAL  CHIEF COMPLAINT:  Chief Complaint  Patient presents with   Multiple Sclerosis    Rm11, husband present, Ms follow up on tysabri     HISTORY OF PRESENT ILLNESS:  Shaeley Segall is a 64 y.o. woman with relapsing remitting MS diagnoses in 1997.   Update 04/14/24: She feels her MS is mostly stable and she continues on Tysabri  (next infusion later today).   Due to pneumonia.  She went to UC and PCP and was placed on Abx.   She feels her respiratory symptoms improved. .    She has no exacerbations but notes some worsening symptoms.       Her last JCV Ab was 09/15/2023 (0.18 negative).     Gait is off balanced and she stumbles frequently with a couple falls a week.   Besides MS she has also had some falls.  .  She has weakness in both legs.   She does some Jazzercise. .  She has some dysesthesias in legs and groin.  Gabapentin  and lamotrigine  help the dysesthesias.       She has urinary hesitancy and self caths 10 times a day - she goes through her month early.   She sees Dr. MacDiarmid at urology.   For spasms she takes Myrbetriq, oxybutynin .  At night she takes desmopressin  which helps her reduce the frequency of catheterization so she can sleep better.  She does not need to catherize at night.   Due to frequent UTI, she is on nitrofurantoin .     At the last visit, after her pneumonia, she had noted reduced mood.  She feels this is back to baseline.  She is on Effexor  and lamotrigine .  Insurance is not can to cover venlafaxine  anymore and we discussed switching to Cymbalta    Fatigue and attention deficit are better with Ritalin .    She takes valium  10 mg nightly and is sleeping well now.   She has LBP.   At L3-L4, there is moderate spinal stenosis due to 2 mm anterolisthesis, disc bulging, severe facet hypertrophy  with joint effusions and ligamenta flava hypertrophy.  There is mild foraminal narrowing and moderate right lateral recess stenosis but there does not appear to be nerve root compression..   At L4-L5, there is mild spinal stenosis due to 6 mm anterolisthesis, severe facet hypertrophy and other degenerative change.  There is moderately severe right and moderate left lateral recess stenosis with some potential for right L5 nerve root compression.She has minimal anterior spondylolisthesis of L3 on L4.    Slight retrolisthesis of L2 on L3.  Multilevel degenerative changes in the lumbar spine, most pronounced at L4-5 and L5-S1.   She will be starting PT.    She also has left knee pain.  A MBB injection had not helped so she never had an ablation     She is trying to avoid surgery.    She wears a brace on that side.  She has osteoporosis (last checked in 05/2019).   She stopped alendronate  a couple years ago and is now on Forteo.   She fractured her hip in 2011.         MS HISTORY:   She presented with left sided numbness in September 1997.   She underwent an MRI and a lumbar puncture and was told MS was unlikely.  She had right sided numbness in January 1998 and had brain and spine MRI.    There were changes suggestive of MS and she was diagnosed in January 1998.   She was started on Betaseron.   She was on Betaseron x 10 years and then switched to Avonex for a year due to tolerability issues.   She had clinical exacerbations and MRI changes and then went on Tysabri  in 2007 or 2008.   SABRA  She tolerates Tysabri  well.   She is JCV Ab negative.     An MRI of the spinal cord dated 02/02/2013 shows demyelinating plaque to the left adjacent to C3, anteriorly adjacent to C4, to the right adjacent to C5-C6, posteriorly to the right adjacent to C6-C7. When that study was compared to an MRI dated 12/29/2006, there was no definite interval change. MRI of the brain the same day was abnormal showing signal changes compatible  with the diagnosis of multiple sclerosis. Compared to the prior study dated 02/23/2012, there was no definite interval change.      MRI brain and cervical spine (09/2014) showing 4 cervical spine foci and typical demyelination plaques in brain (brain unchanged compared to 2012)..    She had MRI 05/2016 after a fall.  Repeat MRI of the brain and cervical spine 03/20/2017 did not show any new lesions compared to the 2016 MRIs.  STUDIES MRI brain 3/13/2024showed Scattered T2/FLAIR hyperintense foci in the cerebral hemispheres and in the right middle cerebellar hemisphere in a pattern consistent with chronic demyelinating plaque associated with multiple sclerosis. None of the foci appear to be acute.    MRI brain 02/25/20 showed T2 hyperintense foci within the hemispheres and cerebellum in a pattern and configuration consistent with chronic demyelinating plaque associated with multiple sclerosis.  None of the foci appear to be acute.  Compared to the MRI dated 03/20/2017, there are no new lesions.  MRI cervical spine 02/25/2020 showed multiple T2 hyperintense foci within the spinal cord consistent with chronic demyelination associated with multiple sclerosis.  None of the foci appear to be acute and there were no new lesions compared to the 03/20/2017 MRI.    Multilevel degenerative changes    There is no spinal stenosis.  Foraminal narrowing is noted at several levels but there does not appear to be nerve root compression.  MRI cervical spine 03/21/2017 showed five T2 hyperintense foci within the spinal cord consistent with chronic demyelinating plaque associated with multiple sclerosis. None of these appear to be acute and they did not enhance. When compared to the MRI dated 08/04/2014, there is no interval change.    Multilevel degenerative changes as detailed above. The degenerative changes at C6-C7 have progressed when compared to the previous MRI.  There does not appear to be any definite nerve root  compression and there is no spinal stenosis or spinal cord compression.  MRI brain 03/20/2017 multiple T2/FLAIR hyperintense foci in the white matter of the hemispheres and cerebellum in a pattern and configuration consistent with chronic demyelinating plaque associated with multiple sclerosis. None of the foci appears to be acute. When compared to the MRI dated 08/17/2014, there is no interim change.     There is a normal enhancement pattern and there are no acute findings.   Bone density 06/02/2019 showed The BMD measured at AP Spine L1-L3 is 0.808 g/cm2 with a T-score of -3.0.   Patient is considered OSTEOPOROTIC according to World Health Organization [facility]) criteria.  MRI brain showed Scattered T2/FLAIR hyperintense  foci in the cerebral hemispheres and in the right middle cerebellar hemisphere in a pattern consistent with chronic demyelinating plaque associated with multiple sclerosis. None of the foci appear to be acute.   MRi lumbar showed At L3-L4, there is moderate spinal stenosis due to 2 mm anterolisthesis, disc bulging, severe facet hypertrophy with joint effusions and ligamenta flava hypertrophy.  There is mild foraminal narrowing and moderate right lateral recess stenosis but there does not appear to be nerve root compression. At L4-L5, there is mild spinal stenosis due to 6 mm anterolisthesis, severe facet hypertrophy and other degenerative change.  There is moderately severe right and moderate left lateral recess stenosis with some potential for right L5 nerve root compression.  REVIEW OF SYSTEMS:  Constitutional: No fevers, chills, sweats, or change in appetite.   Notes fatigue and some insomnia Eyes: No visual changes, double vision, eye pain Ear, nose and throat: No hearing loss, ear pain, nasal congestion, sore throat Cardiovascular: No chest pain, palpitations Respiratory:  No shortness of breath at rest or with exertion.   No wheezes GastrointestinaI: No nausea, vomiting, diarrhea,  abdominal pain.   She has dysphagia Genitourinary: as above. Musculoskeletal:  as above iiIntegumentary: No rash, pruritus, skin lesions Neurological: as above Psychiatric: Mild depression at this time.  Mild anxiety Endocrine: No palpitations, diaphoresis, change in appetite, change in weigh or increased thirst Hematologic/Lymphatic:  No anemia, purpura, petechiae. Allergic/Immunologic: No itchy/runny eyes, nasal congestion, recent allergic reactions, rashes  ALLERGIES: Allergies  Allergen Reactions   Erythromycin Nausea And Vomiting    Other Reaction(s): Not available   Clindamycin     Other Reaction(s): Not available   Clindamycin/Lincomycin    Dilaudid [Hydromorphone Hcl] Other (See Comments)    Hallucinations   Hydromorphone     Other Reaction(s): Not available   Sulfa Antibiotics     Other Reaction(s): Not available   Talc     Other Reaction(s): Not available    HOME MEDICATIONS:  Current Outpatient Medications:    ALPRAZolam  (XANAX ) 0.25 MG tablet, Take 1 tablet (0.25 mg total) by mouth at bedtime as needed for anxiety. TAKE 1 TABLET BY MOUTH AT BEDTIME AS NEEDED FOR ANXIETY, Disp: 30 tablet, Rfl: 0   Calcium Carbonate-Vitamin D 600-125 MG-UNIT TABS, Take by mouth., Disp: , Rfl:    cetirizine (ZYRTEC) 1 MG/ML syrup, Take 5 mg by mouth., Disp: , Rfl:    Cholecalciferol (VITAMIN D3 PO), Take 1,000 Units by mouth daily., Disp: , Rfl:    ciprofloxacin  (CIPRO ) 250 MG tablet, Take 1 tablet (250 mg total) by mouth daily as needed., Disp: 14 tablet, Rfl: 0   desmopressin  (DDAVP ) 0.1 MG tablet, TAKE 1 TABLET BY MOUTH EVERY DAY, Disp: 90 tablet, Rfl: 3   diazepam  (VALIUM ) 5 MG tablet, Take 1 tablet by mouth twice daily as needed, Disp: 180 tablet, Rfl: 0   DULoxetine  (CYMBALTA ) 60 MG capsule, Take 1 capsule (60 mg total) by mouth daily., Disp: 90 capsule, Rfl: 3   famciclovir (FAMVIR) 250 MG tablet, TAKE 1 TABLET BY MOUTH TWICE A DAY, Disp: , Rfl:    Fish Oil-Cholecalciferol  (FISH OIL + D3) 1000-1000 MG-UNIT CAPS, Take 3,600 mg by mouth daily. , Disp: , Rfl:    gabapentin  (NEURONTIN ) 600 MG tablet, TAKE 1 TO 2 TABLETS BY MOUTH DAILY, Disp: 180 tablet, Rfl: 2   lamoTRIgine  (LAMICTAL ) 100 MG tablet, Take 1 tablet by mouth twice daily, Disp: 180 tablet, Rfl: 0   losartan  (COZAAR ) 50 MG tablet, TAKE 1  TABLET BY MOUTH EVERY DAY, Disp: 90 tablet, Rfl: 0   MYRBETRIQ 50 MG TB24 tablet, TAKE 1 TABLET ONCE DAILY. (Patient taking differently: Take 50 mg by mouth 2 (two) times daily.), Disp: 30 tablet, Rfl: 6   natalizumab  (TYSABRI ) 300 MG/15ML injection, Inject into the vein., Disp: , Rfl:    natalizumab  300 mg in sodium chloride  0.9 % 100 mL, Inject into the vein., Disp: , Rfl:    nitrofurantoin  (MACRODANTIN ) 100 MG capsule, Take 1 capsule (100 mg total) by mouth at bedtime., Disp: 90 capsule, Rfl: 0   omeprazole (PRILOSEC) 20 MG capsule, 2 (two) times daily., Disp: , Rfl:    oxybutynin  (DITROPAN -XL) 10 MG 24 hr tablet, TAKE 1 TABLET BY MOUTH DAILY. AS DIRECTED, Disp: 90 tablet, Rfl: 0   simvastatin (ZOCOR) 40 MG tablet, TAKE 1 TABLET AT BEDTIME FOR CHOLESTEROL, Disp: , Rfl:    vitamin B-12 (CYANOCOBALAMIN) 1000 MCG tablet, Take 2,500 mcg by mouth daily. , Disp: , Rfl:    methylphenidate  (RITALIN ) 10 MG tablet, Takes 2 tablets in the morning and 1 tablet midday, Disp: 90 tablet, Rfl: 0   PAST MEDICAL HISTORY: Past Medical History:  Diagnosis Date   Chronic kidney disease    Complication of anesthesia    pt states she got thrush after anesthesia   Multiple sclerosis    Neuropathy    Osteoarthritis    Vision abnormalities     PAST SURGICAL HISTORY: Past Surgical History:  Procedure Laterality Date   CARPAL TUNNEL RELEASE Right    2010   CARPAL TUNNEL RELEASE Left    CESAREAN SECTION     COLONOSCOPY  07/26/14   ENDOMETRIAL ABLATION     2007   HIP FRACTURE SURGERY     hysterctomy     TUBAL LIGATION      FAMILY HISTORY: Family History  Problem Relation Age of  Onset   Lung cancer Mother    Dementia Father    Diverticulitis Father    Pancreatitis Father    Breast cancer Cousin     SOCIAL HISTORY:  Social History   Socioeconomic History   Marital status: Significant Other    Spouse name: Not on file   Number of children: Not on file   Years of education: Not on file   Highest education level: Not on file  Occupational History   Not on file  Tobacco Use   Smoking status: Former    Current packs/day: 0.00    Types: Cigarettes    Quit date: 03/29/1981    Years since quitting: 43.0   Smokeless tobacco: Never   Tobacco comments:    Started smoking at 14 years.    Smoked 0.25 PPD at her heaviest.  Vaping Use   Vaping status: Never Used  Substance and Sexual Activity   Alcohol use: Yes    Alcohol/week: 0.0 standard drinks of alcohol    Comment: Occasional   Drug use: No   Sexual activity: Not on file  Other Topics Concern   Not on file  Social History Narrative   Not on file   Social Drivers of Health   Tobacco Use: Medium Risk (04/14/2024)   Patient History    Smoking Tobacco Use: Former    Smokeless Tobacco Use: Never    Passive Exposure: Not on file  Financial Resource Strain: Low Risk (11/23/2023)   Received from [facility]   Overall Financial Resource Strain (CARDIA)    How hard is it for you to pay  for the very basics like food, housing, medical care, and heating?: Not very hard  Food Insecurity: No Food Insecurity (11/23/2023)   Received from [facility]   Epic    Within the past 12 months, you worried that your food would run out before you got the money to buy more.: Never true    Within the past 12 months, the food you bought just didn't last and you didn't have money to get more.: Never true  Transportation Needs: No Transportation Needs (11/23/2023)   Received from [facility]    In the past 12 months, has lack of transportation kept you from medical appointments or from getting medications?: No     In the past 12 months, has lack of transportation kept you from meetings, work, or from getting things needed for daily living?: No  Physical Activity: Sufficiently Active (11/23/2023)   Received from [facility]   Exercise Vital Sign    On average, how many days per week do you engage in moderate to strenuous exercise (like a brisk walk)?: 5 days    On average, how many minutes do you engage in exercise at this level?: 60 min  Stress: No Stress Concern Present (11/23/2023)   Received from [facility] - Occupational Stress Questionnaire    Do you feel stress - tense, restless, nervous, or anxious, or unable to sleep at night because your mind is troubled all the time - these days?: Not at all  Social Connections: Socially Integrated (11/23/2023)   Received from [facility]   Social Network    How would you rate your social network (family, work, friends)?: Good participation with social networks  Intimate Partner Violence: Not At Risk (11/23/2023)   Received from [facility]   HITS    Over the last 12 months how often did your partner physically hurt you?: Never    Over the last 12 months how often did your partner insult you or talk down to you?: Rarely    Over the last 12 months how often did your partner threaten you with physical harm?: Never    Over the last 12 months how often did your partner scream or curse at you?: Rarely  Depression (PHQ2-9): Not on file  Alcohol Screen: Not on file  Housing: Low Risk (11/23/2023)   Received from [facility]    In the last 12 months, was there a time when you were not able to pay the mortgage or rent on time?: No    In the past 12 months, how many times have you moved where you were living?: 0    At any time in the past 12 months, were you homeless or living in a shelter (including now)?: No  Utilities: Not At Risk (11/23/2023)   Received from [facility]    In the past 12 months  has the electric, gas, oil, or water company threatened to shut off services in your home?: No  Health Literacy: Not on file     PHYSICAL EXAM from 02/12/2018  Vitals:   04/14/24 1328  BP: 112/60  Pulse: 75  Resp: 14  Weight: 145 lb 8 oz (66 kg)  Height: 5' 2.2 (1.58 m)    Body mass index is 26.44 kg/m.   General: The patient is well-developed and well-nourished and in no acute distress   Neurologic Exam  Mental status: The patient is alert  and oriented x 3 at the time of the examination. The patient has apparent normal recent and remote memory, with an apparently normal attention span and concentration ability.   Speech is normal.  Cranial nerves: Extraocular movements are full.  Facial strength and sensation is normal.  Hearing seemed normal.   Motor:  Muscle bulk is normal.    Muscle tone is increased in the legs, right greater than left.  Strength is 5/5 except 4+/5 iliopsoas muscles  Sensory: Se has intact touch and vibration in arms and reduced vibraiton in legs, left much worse than right .   Coordination: Cerebellar testing shows good finger-nose-finger but mildly reduced heel-to-shin.  Gait and station: Station is normal.   She leans forward while walking.  Her gait is mildly wide.  Her turn is unstable.  She cannot do a good tandem walk.  Romberg is negative.  Reflexes: Deep tendon reflexes are increased bilaterally in legs.  DTRs are increased at the knees with spread.  There is no ankle clonus.     25 foot timed walk (average of 2):   8.8 sec     Active secondary progressive multiple sclerosis  Abnormality of gait  Attention deficit disorder (ADD) without hyperactivity  Neurogenic bladder  Depression, unspecified depression type  Chronic midline low back pain without sciatica  Spondylolisthesis of lumbosacral region   1.    For now continue Tysabri  - has been exacerbation and new lesion free. We discussed considering a different medication based  on MRIi stability.   2.   She will continue to stay active and exercise as tolerated.     Trial of dalfampridine  10 mg po bid.    3.    Continue medications including Ritalin  for ADD/fatigue.  4.    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  If pain worse, refer to neurosurgery as she has spinal stenosis and 6 mm anterolisthesis at L4L5 and other significant DJD.   However, she is very reluctant to do surgery so does not want to go now 5.    She will return to see us  in 6 months or sooner if there are new or worsening neurologic symptoms.  43-minute office visit with the majority of the time spent face-to-face for history and physical, MRI personal review, discussion/counseling and decision-making.  Additional time with record review and documentation.  This visit is part of a comprehensive longitudinal care medical relationship regarding the patients primary diagnosis of MS and related concerns.   Brandii Lakey A. Vear, MD, PhD 04/14/2024, 2:10 PM Certified in Neurology, Clinical Neurophysiology, Sleep Medicine, Pain Medicine and Neuroimaging  [facility] [address] [place], [place] [address] [phone]   "

## 2024-04-15 LAB — CBC WITH DIFFERENTIAL/PLATELET
Basophils Absolute: 0.1 10*3/uL (ref 0.0–0.2)
Basos: 1 %
EOS (ABSOLUTE): 0.4 10*3/uL (ref 0.0–0.4)
Eos: 6 %
Hematocrit: 33.4 % — ABNORMAL LOW (ref 34.0–46.6)
Hemoglobin: 11.7 g/dL (ref 11.1–15.9)
Immature Grans (Abs): 0 10*3/uL (ref 0.0–0.1)
Immature Granulocytes: 0 %
Lymphocytes Absolute: 3 10*3/uL (ref 0.7–3.1)
Lymphs: 42 %
MCH: 32.4 pg (ref 26.6–33.0)
MCHC: 35 g/dL (ref 31.5–35.7)
MCV: 93 fL (ref 79–97)
Monocytes Absolute: 0.5 10*3/uL (ref 0.1–0.9)
Monocytes: 8 %
NRBC: 1 % — ABNORMAL HIGH (ref 0–0)
Neutrophils Absolute: 2.9 10*3/uL (ref 1.4–7.0)
Neutrophils: 42 %
Platelets: 208 10*3/uL (ref 150–450)
RBC: 3.61 x10E6/uL — ABNORMAL LOW (ref 3.77–5.28)
RDW: 13.4 % (ref 11.7–15.4)
WBC: 7 10*3/uL (ref 3.4–10.8)

## 2024-04-15 LAB — STRATIFY JCV(TM) AB W/INDEX

## 2024-05-20 ENCOUNTER — Other Ambulatory Visit

## 2024-12-02 ENCOUNTER — Ambulatory Visit: Admitting: Neurology
